# Patient Record
Sex: Female | Born: 1945 | Race: White | Hispanic: No | Marital: Married | Smoking: Never smoker
Health system: Southern US, Community
[De-identification: ages and names within clinical notes are randomized; demographics above are authoritative.]

## PROBLEM LIST (undated history)

## (undated) DIAGNOSIS — I4891 Unspecified atrial fibrillation: Secondary | ICD-10-CM

## (undated) DIAGNOSIS — D649 Anemia, unspecified: Secondary | ICD-10-CM

## (undated) DIAGNOSIS — L02419 Cutaneous abscess of limb, unspecified: Secondary | ICD-10-CM

## (undated) DIAGNOSIS — E119 Type 2 diabetes mellitus without complications: Secondary | ICD-10-CM

## (undated) DIAGNOSIS — D61818 Other pancytopenia: Secondary | ICD-10-CM

## (undated) DIAGNOSIS — I10 Essential (primary) hypertension: Secondary | ICD-10-CM

## (undated) DIAGNOSIS — K7682 Hepatic encephalopathy: Secondary | ICD-10-CM

## (undated) DIAGNOSIS — K7290 Hepatic failure, unspecified without coma: Secondary | ICD-10-CM

## (undated) DIAGNOSIS — A419 Sepsis, unspecified organism: Secondary | ICD-10-CM

## (undated) DIAGNOSIS — R791 Abnormal coagulation profile: Secondary | ICD-10-CM

## (undated) DIAGNOSIS — E039 Hypothyroidism, unspecified: Secondary | ICD-10-CM

## (undated) HISTORY — PX: CARDIAC CATHETERIZATION: SHX172

## (undated) HISTORY — PX: BACK SURGERY: SHX140

## (undated) HISTORY — PX: HERNIA REPAIR: SHX51

---

## 2020-12-18 ENCOUNTER — Emergency Department (HOSPITAL_COMMUNITY): Payer: Medicare Other

## 2020-12-18 ENCOUNTER — Observation Stay (HOSPITAL_COMMUNITY)
Admission: EM | Admit: 2020-12-18 | Discharge: 2020-12-20 | Disposition: A | Discharge status: Home / Self Care | Payer: Medicare Other | Attending: Family Medicine | Admitting: Family Medicine

## 2020-12-18 ENCOUNTER — Observation Stay (HOSPITAL_COMMUNITY): Payer: Medicare Other

## 2020-12-18 ENCOUNTER — Encounter (HOSPITAL_COMMUNITY): Payer: Self-pay

## 2020-12-18 DIAGNOSIS — Z20822 Contact with and (suspected) exposure to covid-19: Secondary | ICD-10-CM | POA: Diagnosis not present

## 2020-12-18 DIAGNOSIS — G459 Transient cerebral ischemic attack, unspecified: Secondary | ICD-10-CM | POA: Diagnosis not present

## 2020-12-18 DIAGNOSIS — K7460 Unspecified cirrhosis of liver: Secondary | ICD-10-CM | POA: Insufficient documentation

## 2020-12-18 DIAGNOSIS — I4891 Unspecified atrial fibrillation: Secondary | ICD-10-CM

## 2020-12-18 DIAGNOSIS — R197 Diarrhea, unspecified: Secondary | ICD-10-CM

## 2020-12-18 DIAGNOSIS — K7290 Hepatic failure, unspecified without coma: Secondary | ICD-10-CM | POA: Diagnosis not present

## 2020-12-18 DIAGNOSIS — E119 Type 2 diabetes mellitus without complications: Secondary | ICD-10-CM | POA: Diagnosis not present

## 2020-12-18 DIAGNOSIS — I480 Paroxysmal atrial fibrillation: Secondary | ICD-10-CM | POA: Diagnosis not present

## 2020-12-18 DIAGNOSIS — Z794 Long term (current) use of insulin: Secondary | ICD-10-CM | POA: Insufficient documentation

## 2020-12-18 DIAGNOSIS — L02419 Cutaneous abscess of limb, unspecified: Secondary | ICD-10-CM

## 2020-12-18 DIAGNOSIS — R479 Unspecified speech disturbances: Secondary | ICD-10-CM

## 2020-12-18 DIAGNOSIS — Z79899 Other long term (current) drug therapy: Secondary | ICD-10-CM | POA: Insufficient documentation

## 2020-12-18 DIAGNOSIS — E039 Hypothyroidism, unspecified: Secondary | ICD-10-CM | POA: Diagnosis not present

## 2020-12-18 DIAGNOSIS — K7469 Other cirrhosis of liver: Secondary | ICD-10-CM

## 2020-12-18 DIAGNOSIS — E538 Deficiency of other specified B group vitamins: Secondary | ICD-10-CM | POA: Diagnosis not present

## 2020-12-18 DIAGNOSIS — R2689 Other abnormalities of gait and mobility: Secondary | ICD-10-CM | POA: Insufficient documentation

## 2020-12-18 DIAGNOSIS — Z7901 Long term (current) use of anticoagulants: Secondary | ICD-10-CM | POA: Diagnosis not present

## 2020-12-18 DIAGNOSIS — I10 Essential (primary) hypertension: Secondary | ICD-10-CM | POA: Diagnosis not present

## 2020-12-18 DIAGNOSIS — Z7984 Long term (current) use of oral hypoglycemic drugs: Secondary | ICD-10-CM | POA: Insufficient documentation

## 2020-12-18 DIAGNOSIS — R4182 Altered mental status, unspecified: Secondary | ICD-10-CM | POA: Diagnosis present

## 2020-12-18 DIAGNOSIS — K7682 Hepatic encephalopathy: Secondary | ICD-10-CM

## 2020-12-18 DIAGNOSIS — E1165 Type 2 diabetes mellitus with hyperglycemia: Secondary | ICD-10-CM

## 2020-12-18 HISTORY — DX: Hypothyroidism, unspecified: E03.9

## 2020-12-18 HISTORY — DX: Essential (primary) hypertension: I10

## 2020-12-18 HISTORY — DX: Hepatic encephalopathy: K76.82

## 2020-12-18 HISTORY — DX: Anemia, unspecified: D64.9

## 2020-12-18 HISTORY — DX: Other pancytopenia: D61.818

## 2020-12-18 HISTORY — DX: Hepatic failure, unspecified without coma: K72.90

## 2020-12-18 HISTORY — DX: Type 2 diabetes mellitus without complications: E11.9

## 2020-12-18 HISTORY — DX: Cutaneous abscess of limb, unspecified: L02.419

## 2020-12-18 HISTORY — DX: Abnormal coagulation profile: R79.1

## 2020-12-18 HISTORY — DX: Unspecified atrial fibrillation: I48.91

## 2020-12-18 HISTORY — DX: Sepsis, unspecified organism: A41.9

## 2020-12-18 LAB — COMPREHENSIVE METABOLIC PANEL
ALT: 22 U/L (ref 0–44)
AST: 52 U/L — ABNORMAL HIGH (ref 15–41)
Albumin: 2.4 g/dL — ABNORMAL LOW (ref 3.5–5.0)
Alkaline Phosphatase: 197 U/L — ABNORMAL HIGH (ref 38–126)
Anion gap: 21 — ABNORMAL HIGH (ref 5–15)
BUN: 17 mg/dL (ref 8–23)
CO2: 23 mmol/L (ref 22–32)
Calcium: 8.1 mg/dL — ABNORMAL LOW (ref 8.9–10.3)
Chloride: 98 mmol/L (ref 98–111)
Creatinine, Ser: 1.36 mg/dL — ABNORMAL HIGH (ref 0.44–1.00)
GFR, Estimated: 40 mL/min — ABNORMAL LOW (ref >60)
Glucose, Bld: 140 mg/dL — ABNORMAL HIGH (ref 70–99)
Potassium: 4 mmol/L (ref 3.5–5.1)
Sodium: 142 mmol/L (ref 135–145)
Total Bilirubin: 3.3 mg/dL — ABNORMAL HIGH (ref 0.3–1.2)
Total Protein: 6.1 g/dL — ABNORMAL LOW (ref 6.5–8.1)

## 2020-12-18 LAB — CBC
HCT: 40 % (ref 36.0–46.0)
Hemoglobin: 12.7 g/dL (ref 12.0–15.0)
MCH: 33 pg (ref 26.0–34.0)
MCHC: 31.8 g/dL (ref 30.0–36.0)
MCV: 103.9 fL — ABNORMAL HIGH (ref 80.0–100.0)
Platelets: 237 10*3/uL (ref 150–400)
RBC: 3.85 MIL/uL — ABNORMAL LOW (ref 3.87–5.11)
RDW: 17.1 % — ABNORMAL HIGH (ref 11.5–15.5)
WBC: 8.8 10*3/uL (ref 4.0–10.5)
nRBC: 0 % (ref 0.0–0.2)

## 2020-12-18 LAB — URINALYSIS, ROUTINE W REFLEX MICROSCOPIC
Bilirubin Urine: NEGATIVE
Glucose, UA: NEGATIVE mg/dL
Hgb urine dipstick: NEGATIVE
Ketones, ur: NEGATIVE mg/dL
Leukocytes,Ua: NEGATIVE
Nitrite: NEGATIVE
Protein, ur: NEGATIVE mg/dL
Specific Gravity, Urine: 1.014 (ref 1.005–1.030)
pH: 5 (ref 5.0–8.0)

## 2020-12-18 LAB — CBG MONITORING, ED: Glucose-Capillary: 135 mg/dL — ABNORMAL HIGH (ref 70–99)

## 2020-12-18 LAB — DIFFERENTIAL
Abs Immature Granulocytes: 0.04 10*3/uL (ref 0.00–0.07)
Basophils Absolute: 0.1 10*3/uL (ref 0.0–0.1)
Basophils Relative: 1 %
Eosinophils Absolute: 0.1 10*3/uL (ref 0.0–0.5)
Eosinophils Relative: 1 %
Immature Granulocytes: 1 %
Lymphocytes Relative: 29 %
Lymphs Abs: 2.5 10*3/uL (ref 0.7–4.0)
Monocytes Absolute: 0.8 10*3/uL (ref 0.1–1.0)
Monocytes Relative: 9 %
Neutro Abs: 5.3 10*3/uL (ref 1.7–7.7)
Neutrophils Relative %: 59 %

## 2020-12-18 LAB — I-STAT CHEM 8, ED
BUN: 18 mg/dL (ref 8–23)
Calcium, Ion: 0.84 mmol/L — CL (ref 1.15–1.40)
Chloride: 97 mmol/L — ABNORMAL LOW (ref 98–111)
Creatinine, Ser: 1 mg/dL (ref 0.44–1.00)
Glucose, Bld: 130 mg/dL — ABNORMAL HIGH (ref 70–99)
HCT: 39 % (ref 36.0–46.0)
Hemoglobin: 13.3 g/dL (ref 12.0–15.0)
Potassium: 3.9 mmol/L (ref 3.5–5.1)
Sodium: 142 mmol/L (ref 135–145)
TCO2: 24 mmol/L (ref 22–32)

## 2020-12-18 LAB — LIPID PANEL
Cholesterol: 108 mg/dL (ref 0–200)
HDL: 18 mg/dL — ABNORMAL LOW (ref >40)
LDL Cholesterol: 75 mg/dL (ref 0–99)
Total CHOL/HDL Ratio: 6 RATIO
Triglycerides: 75 mg/dL (ref <150)
VLDL: 15 mg/dL (ref 0–40)

## 2020-12-18 LAB — HEMOGLOBIN A1C
Hgb A1c MFr Bld: 5.8 % — ABNORMAL HIGH (ref 4.8–5.6)
Mean Plasma Glucose: 119.76 mg/dL

## 2020-12-18 LAB — PROTIME-INR
INR: 2.5 — ABNORMAL HIGH (ref 0.8–1.2)
Prothrombin Time: 26.7 seconds — ABNORMAL HIGH (ref 11.4–15.2)

## 2020-12-18 LAB — APTT: aPTT: 39 seconds — ABNORMAL HIGH (ref 24–36)

## 2020-12-18 LAB — TSH: TSH: 1.057 u[IU]/mL (ref 0.350–4.500)

## 2020-12-18 LAB — AMMONIA: Ammonia: 115 umol/L — ABNORMAL HIGH (ref 9–35)

## 2020-12-18 MED ORDER — AMIODARONE HCL IN DEXTROSE 360-4.14 MG/200ML-% IV SOLN
30.0000 mg/h | INTRAVENOUS | Status: DC
  Administered 2020-12-19 (×3): 30 mg/h via INTRAVENOUS
  Filled 2020-12-18 (×3): qty 200

## 2020-12-18 MED ORDER — LORAZEPAM 2 MG/ML IJ SOLN
0.5000 mg | Freq: Once | INTRAMUSCULAR | Status: DC | PRN
  Filled 2020-12-18: qty 1

## 2020-12-18 MED ORDER — SPIRONOLACTONE 25 MG PO TABS
25.0000 mg | ORAL_TABLET | Freq: Every day | ORAL | Status: DC
  Administered 2020-12-19: 25 mg via ORAL
  Filled 2020-12-18 (×2): qty 1

## 2020-12-18 MED ORDER — AMIODARONE LOAD VIA INFUSION
150.0000 mg | Freq: Once | INTRAVENOUS | Status: AC
  Administered 2020-12-18: 150 mg via INTRAVENOUS
  Filled 2020-12-18: qty 83.34

## 2020-12-18 MED ORDER — IOHEXOL 350 MG/ML SOLN
75.0000 mL | Freq: Once | INTRAVENOUS | Status: AC | PRN
  Administered 2020-12-18: 75 mL via INTRAVENOUS

## 2020-12-18 MED ORDER — WARFARIN SODIUM 5 MG PO TABS: 5.0000 mg | ORAL_TABLET | ORAL | Status: DC

## 2020-12-18 MED ORDER — ATORVASTATIN CALCIUM 40 MG PO TABS
40.0000 mg | ORAL_TABLET | Freq: Every day | ORAL | Status: DC
  Administered 2020-12-18: 40 mg via ORAL
  Filled 2020-12-18 (×2): qty 1

## 2020-12-18 MED ORDER — LACTULOSE 10 GM/15ML PO SOLN
10.0000 g | Freq: Three times a day (TID) | ORAL | Status: DC
  Administered 2020-12-18 – 2020-12-20 (×5): 10 g via ORAL
  Filled 2020-12-18 (×3): qty 30
  Filled 2020-12-18: qty 15
  Filled 2020-12-18: qty 30
  Filled 2020-12-18: qty 15
  Filled 2020-12-18: qty 30

## 2020-12-18 MED ORDER — STROKE: EARLY STAGES OF RECOVERY BOOK: Freq: Once | Status: AC

## 2020-12-18 MED ORDER — SODIUM CHLORIDE 0.9 % IV BOLUS
1000.0000 mL | Freq: Once | INTRAVENOUS | Status: AC
  Administered 2020-12-18: 1000 mL via INTRAVENOUS

## 2020-12-18 MED ORDER — METOPROLOL TARTRATE 25 MG PO TABS
25.0000 mg | ORAL_TABLET | Freq: Two times a day (BID) | ORAL | Status: DC
  Filled 2020-12-18: qty 1

## 2020-12-18 MED ORDER — INSULIN ASPART PROT & ASPART (70-30 MIX) 100 UNIT/ML ~~LOC~~ SUSP
20.0000 [IU] | Freq: Two times a day (BID) | SUBCUTANEOUS | Status: DC
  Administered 2020-12-19 – 2020-12-20 (×3): 20 [IU] via SUBCUTANEOUS
  Filled 2020-12-18: qty 10

## 2020-12-18 MED ORDER — NYSTATIN 100000 UNIT/GM EX POWD
Freq: Two times a day (BID) | CUTANEOUS | Status: AC
  Filled 2020-12-18: qty 15

## 2020-12-18 MED ORDER — FOLIC ACID 1 MG PO TABS
1.0000 mg | ORAL_TABLET | Freq: Every day | ORAL | Status: DC
  Administered 2020-12-19 – 2020-12-20 (×2): 1 mg via ORAL
  Filled 2020-12-18 (×2): qty 1

## 2020-12-18 MED ORDER — INSULIN ISOPHANE & REGULAR (HUMAN 70-30)100 UNIT/ML KWIKPEN: 20.0000 [IU] | PEN_INJECTOR | Freq: Two times a day (BID) | SUBCUTANEOUS | Status: DC

## 2020-12-18 MED ORDER — AMIODARONE HCL IN DEXTROSE 360-4.14 MG/200ML-% IV SOLN
60.0000 mg/h | INTRAVENOUS | Status: DC
  Administered 2020-12-19: 60 mg/h via INTRAVENOUS
  Filled 2020-12-18: qty 200

## 2020-12-18 MED ORDER — WARFARIN SODIUM 5 MG PO TABS
5.0000 mg | ORAL_TABLET | Freq: Once | ORAL | Status: AC
  Administered 2020-12-18: 5 mg via ORAL
  Filled 2020-12-18: qty 1

## 2020-12-18 MED ORDER — SODIUM CHLORIDE 0.9 % IV SOLN: INTRAVENOUS | Status: DC

## 2020-12-18 MED ORDER — LORAZEPAM 2 MG/ML IJ SOLN
1.0000 mg | Freq: Once | INTRAMUSCULAR | Status: AC | PRN
  Administered 2020-12-18: 1 mg via INTRAVENOUS

## 2020-12-18 MED ORDER — SODIUM CHLORIDE 0.9% FLUSH
3.0000 mL | Freq: Once | INTRAVENOUS | Status: AC
Start: 2020-12-18 — End: 2020-12-18
  Administered 2020-12-18: 3 mL via INTRAVENOUS

## 2020-12-18 MED ORDER — SENNOSIDES-DOCUSATE SODIUM 8.6-50 MG PO TABS: 1.0000 | ORAL_TABLET | Freq: Every evening | ORAL | Status: DC | PRN

## 2020-12-18 MED ORDER — VITAMIN B-12 1000 MCG PO TABS
1000.0000 ug | ORAL_TABLET | Freq: Every day | ORAL | Status: DC
  Administered 2020-12-19 – 2020-12-20 (×2): 1000 ug via ORAL
  Filled 2020-12-18 (×2): qty 1

## 2020-12-18 MED ORDER — LEVOTHYROXINE SODIUM 112 MCG PO TABS
112.0000 ug | ORAL_TABLET | Freq: Every day | ORAL | Status: DC
  Administered 2020-12-19 – 2020-12-20 (×2): 112 ug via ORAL
  Filled 2020-12-18 (×2): qty 1

## 2020-12-18 MED ORDER — ONDANSETRON 4 MG PO TBDP: 8.0000 mg | ORAL_TABLET | Freq: Three times a day (TID) | ORAL | Status: DC | PRN

## 2020-12-18 MED ORDER — PANTOPRAZOLE SODIUM 40 MG PO TBEC
40.0000 mg | DELAYED_RELEASE_TABLET | Freq: Every day | ORAL | Status: DC
  Administered 2020-12-19 – 2020-12-20 (×2): 40 mg via ORAL
  Filled 2020-12-18 (×2): qty 1

## 2020-12-18 MED ORDER — WARFARIN - PHARMACIST DOSING INPATIENT: Freq: Every day | Status: DC

## 2020-12-18 MED ORDER — ALBUTEROL SULFATE (2.5 MG/3ML) 0.083% IN NEBU: 2.5000 mg | INHALATION_SOLUTION | RESPIRATORY_TRACT | Status: DC | PRN

## 2020-12-18 MED ORDER — INSULIN ASPART 100 UNIT/ML ~~LOC~~ SOLN
0.0000 [IU] | Freq: Three times a day (TID) | SUBCUTANEOUS | Status: DC
  Administered 2020-12-19: 3 [IU] via SUBCUTANEOUS
  Administered 2020-12-19 – 2020-12-20 (×2): 2 [IU] via SUBCUTANEOUS

## 2020-12-18 MED ORDER — FERROUS SULFATE 325 (65 FE) MG PO TABS
325.0000 mg | ORAL_TABLET | Freq: Every day | ORAL | Status: DC
  Administered 2020-12-19 – 2020-12-20 (×2): 325 mg via ORAL
  Filled 2020-12-18 (×2): qty 1

## 2020-12-18 NOTE — ED Notes (Signed)
Blood pressure cuff placed on upper right arm, with larger cuff and readings appear better at this time.

## 2020-12-18 NOTE — Progress Notes (Signed)
Spoke with Consuella Lose RN regarding consult. ED RN able to gain access. Will complete consult at this time per Consuella Lose RN request. Tomasita Morrow, RN VAST

## 2020-12-18 NOTE — Code Documentation (Addendum)
Pt is a 75 yr old female with PMH a fib on coumadin and L leg cellulitis who was last known well this morning at 0700. According to EMS, 911 was called when pt was having aphasia. Upon EMS arrival, she had a breif rt gaze prefference and a left arm drift. Pt arrived [facility] at 1218. She was alert, verbal and had no weakness. She was in AF with HR 130s. CBG and labs drawn, cleared at bridge by EDP. See flowsheet for times and full NIHSS. Pt taken to CT scan at 1225. NCCT neg for hemorrhage per neurologist. Delay getting IV access due to poor vasculature. Once IV access obtained, CTA performed. Pt returned to ED room 34 at 1300. Her NIHSS is 1 for incorrect month. No thrombolytic as on coumadin with INR 2.5.  No NIR as pt LVO negative. Pt will need q 2 hr VS and mNIHSS for 12 hr then q 4. Plan of care discussed with Chauncey Reading.

## 2020-12-18 NOTE — ED Provider Notes (Signed)
[facility] EMERGENCY DEPARTMENT Provider Note   CSN: [id number] Arrival date & time: 12/18/20  1218     History Chief Complaint  Patient presents with  . Code Stroke    Kari Day is a 75 y.o. female.  The history is provided by the patient, medical records, a relative and the EMS personnel. No language interpreter was used.  Neurologic Problem This is a new problem. The current episode started 3 to 5 hours ago. The problem occurs rarely. The problem has been resolved. Pertinent negatives include no chest pain, no abdominal pain, no headaches and no shortness of breath. Nothing aggravates the symptoms. Nothing relieves the symptoms. She has tried nothing for the symptoms. The treatment provided no relief.       No past medical history on file.  There are no problems to display for this patient.      OB History   No obstetric history on file.     No family history on file.     Home Medications Prior to Admission medications   Not on File    Allergies    Patient has no allergy information on record.  Review of Systems   Review of Systems  Constitutional: Negative for chills, diaphoresis, fatigue and fever.  HENT: Negative for congestion.   Eyes: Negative for visual disturbance.  Respiratory: Negative for cough, chest tightness, shortness of breath and wheezing.   Cardiovascular: Negative for chest pain, palpitations and leg swelling.  Gastrointestinal: Negative for abdominal pain, constipation, diarrhea, nausea and vomiting.  Genitourinary: Negative for dysuria and flank pain.  Musculoskeletal: Negative for back pain, neck pain and neck stiffness.  Neurological: Positive for speech difficulty and weakness (per eMS). Negative for dizziness, facial asymmetry, light-headedness, numbness and headaches.  Psychiatric/Behavioral: Negative for agitation and confusion.  All other systems reviewed and are negative.   Physical Exam Updated Vital  Signs BP 96/71   Pulse (!) 149   Temp 97.8 F (36.6 C) (Oral)   Resp 18   SpO2 100%   Physical Exam Vitals and nursing note reviewed.  Constitutional:      General: She is not in acute distress.    Appearance: She is well-developed. She is not ill-appearing, toxic-appearing or diaphoretic.  HENT:     Head: Normocephalic and atraumatic.     Nose: No congestion or rhinorrhea.     Mouth/Throat:     Mouth: Mucous membranes are moist.     Pharynx: No oropharyngeal exudate or posterior oropharyngeal erythema.  Eyes:     Extraocular Movements: Extraocular movements intact.     Conjunctiva/sclera: Conjunctivae normal.     Pupils: Pupils are equal, round, and reactive to light.  Cardiovascular:     Rate and Rhythm: Normal rate and regular rhythm.     Heart sounds: No murmur heard.   Pulmonary:     Effort: Pulmonary effort is normal. No respiratory distress.     Breath sounds: Normal breath sounds.  Abdominal:     General: Abdomen is flat.     Palpations: Abdomen is soft.     Tenderness: There is no abdominal tenderness. There is no right CVA tenderness, left CVA tenderness, guarding or rebound.  Musculoskeletal:     Cervical back: Neck supple.  Skin:    General: Skin is warm and dry.  Neurological:     Mental Status: She is alert.     ED Results / Procedures / Treatments   Labs (all labs ordered are listed, but  only abnormal results are displayed) Labs Reviewed  PROTIME-INR - Abnormal; Notable for the following components:      Result Value   Prothrombin Time 26.7 (*)    INR 2.5 (*)    All other components within normal limits  APTT - Abnormal; Notable for the following components:   aPTT 39 (*)    All other components within normal limits  CBC - Abnormal; Notable for the following components:   RBC 3.85 (*)    MCV 103.9 (*)    RDW 17.1 (*)    All other components within normal limits  COMPREHENSIVE METABOLIC PANEL - Abnormal; Notable for the following components:    Glucose, Bld 140 (*)    Creatinine, Ser 1.36 (*)    Calcium 8.1 (*)    Total Protein 6.1 (*)    Albumin 2.4 (*)    AST 52 (*)    Alkaline Phosphatase 197 (*)    Total Bilirubin 3.3 (*)    GFR, Estimated 40 (*)    Anion gap 21 (*)    All other components within normal limits  HEMOGLOBIN A1C - Abnormal; Notable for the following components:   Hgb A1c MFr Bld 5.8 (*)    All other components within normal limits  LIPID PANEL - Abnormal; Notable for the following components:   HDL 18 (*)    All other components within normal limits  AMMONIA - Abnormal; Notable for the following components:   Ammonia 115 (*)    All other components within normal limits  I-STAT CHEM 8, ED - Abnormal; Notable for the following components:   Chloride 97 (*)    Glucose, Bld 130 (*)    Calcium, Ion 0.84 (*)    All other components within normal limits  CBG MONITORING, ED - Abnormal; Notable for the following components:   Glucose-Capillary 135 (*)    All other components within normal limits  URINE CULTURE  DIFFERENTIAL  TSH  URINALYSIS, ROUTINE W REFLEX MICROSCOPIC    EKG EKG Interpretation  Date/Time:  Wednesday December 18 2020 13:05:57 EDT Ventricular Rate:  125 PR Interval:    QRS Duration: 91 QT Interval:  334 QTC Calculation: 482 R Axis:   88 Text Interpretation: Atrial fibrillation Borderline right axis deviation Borderline low voltage, extremity leads Borderline repolarization abnormality No prior ECG for comparison. No STEMI Confirmed by Theda Belfastegeler, Chris ([id number]) on 12/18/2020 1:27:49 PM   Radiology CT HEAD CODE STROKE WO CONTRAST  Addendum Date: 12/18/2020   ADDENDUM REPORT: 12/18/2020 12:50 ADDENDUM: These results were communicated to Dr. Amada JupiterKirkpatrick At 12:40 pmon 4/13/2022by text page via the [facility] messaging system. Electronically Signed   By: Jackey LogeKyle  Golden DO   On: 12/18/2020 12:50   Result Date: 12/18/2020 CLINICAL DATA:  Code stroke. Neuro deficit, acute, stroke suspected. EXAM: CT  HEAD WITHOUT CONTRAST TECHNIQUE: Contiguous axial images were obtained from the base of the skull through the vertex without intravenous contrast. COMPARISON:  No pertinent prior exams available for comparison. FINDINGS: Brain: Mild cerebral atrophy. There is no acute intracranial hemorrhage. No demarcated cortical infarct. No extra-axial fluid collection. No evidence of intracranial mass. No midline shift. Partially empty sella turcica. Vascular: No hyperdense vessel.  Atherosclerotic calcifications. Skull: Normal. Negative for fracture or focal lesion. Sinuses/Orbits: Visualized orbits show no acute finding. Mild bilateral ethmoid sinus mucosal thickening. ASPECTS [facility]([place] Stroke Program Early CT Score) - Ganglionic level infarction (caudate, lentiform nuclei, internal capsule, insula, M1-M3 cortex): 7 - Supraganglionic infarction (M4-M6 cortex): 3 Total score (0-10  with 10 being normal): 10 IMPRESSION: No evidence of acute intracranial abnormality.  ASPECTS is 10. Electronically Signed: By: Jackey Loge DO On: 12/18/2020 12:44   CT ANGIO HEAD NECK W WO CM (CODE STROKE)  Result Date: 12/18/2020 CLINICAL DATA:  Neuro deficit, acute, stroke suspected. EXAM: CT HEAD WITHOUT CONTRAST CT ANGIOGRAPHY HEAD AND NECK CT PERFUSION BRAIN TECHNIQUE: Multidetector CT imaging of the head and neck was performed using the standard protocol during bolus administration of intravenous contrast. Multiplanar CT image reconstructions and MIPs were obtained to evaluate the vascular anatomy. Carotid stenosis measurements (when applicable) are obtained utilizing NASCET criteria, using the distal internal carotid diameter as the denominator. Multiphase CT imaging of the brain was performed following IV bolus contrast injection. Subsequent parametric perfusion maps were calculated using RAPID software. CONTRAST:  75mL OMNIPAQUE IOHEXOL 350 MG/ML SOLN COMPARISON:  Noncontrast head CT performed earlier today 12/18/2020. FINDINGS: CTA NECK  FINDINGS Aortic arch: Standard aortic branching. Mild atherosclerotic plaque within the visualized aortic arch. No hemodynamically significant innominate or proximal subclavian artery stenosis. Right carotid system: CCA and ICA patent within the neck. Calcified plaque within the mid to distal CCA resulting in less than 50% stenosis. Left carotid system: CCA and ICA patent within the neck without stenosis. Minimal soft and calcified plaque within the CCA. Partially retropharyngeal course of the cervical ICA Vertebral arteries: The vertebral arteries are developmentally diminutive, but patent. Moderate stenosis within the proximal left V1 segment. Skeleton: Cervical spondylosis with multilevel spinal canal and neural foraminal narrowing. Partially imaged thoracic levocurvature. No acute bony abnormality or aggressive osseous lesion. Other neck: No neck mass or cervical lymphadenopathy. Thyroid unremarkable. Upper chest: Nodules within the bilateral lung apices measuring up to 5 mm. Review of the MIP images confirms the above findings CTA HEAD FINDINGS Anterior circulation: The intracranial internal carotid arteries are patent. Calcified plaqu. E within both vessels with no more than mild stenosis The M1 middle cerebral arteries are patent. No M2 proximal branch occlusion or high-grade proximal stenosis is identified. The anterior cerebral arteries are patent with distal branch atherosclerotic irregularity but no significant proximal stenosis. No intracranial aneurysm is identified. Posterior circulation: The intracranial vertebral arteries are patent with mild atherosclerotic irregularity and sites of mild stenosis. The basilar artery is patent. The posterior cerebral arteries are patent. The P1 segments are hypoplastic bilaterally with sizable bilateral posterior communicating arteries. Venous sinuses: Within the limitations of contrast timing, no convincing thrombus. Anatomic variants: As described Review of the MIP  images confirms the above findings No emergent large vessel occlusion. These results were communicated to [facility] at 1:30 pmon 4/13/2022by text page via the [id number] messaging system. IMPRESSION: CTA neck: 1. Common and internal carotid arteries are patent within the neck. Atherosclerotic plaque within the mid to distal right CCA results in less than 50% stenosis. Minimal plaque within the left CCA. 2. The vertebral arteries are developmentally diminutive, but patent. Moderate stenosis of the proximal left V1 segment. 3. Multiple pulmonary nodules within the bilateral lung apices measuring up to 5 mm. No follow-up needed if patient is low-risk (and has no known or suspected primary neoplasm). Non-contrast chest CT can be considered in 12 months if patient is high-risk. This recommendation follows the consensus statement: Guidelines for Management of Incidental Pulmonary Nodules Detected on CT Images: From the Fleischner Society 2017; Radiology 2017; 284:228-243. CTA head: 1. No intracranial large vessel occlusion or proximal high-grade arterial stenosis. 2. Distal ACA branch atherosclerotic irregularity bilaterally. 3. Sites of mild atherosclerotic narrowing  within the V4 vertebral arteries bilaterally. Electronically Signed   By: Jackey Loge DO   On: 12/18/2020 13:32    Procedures Procedures   Medications Ordered in ED Medications  LORazepam (ATIVAN) injection 0.5 mg (has no administration in time range)   stroke: mapping our early stages of recovery book (has no administration in time range)  senna-docusate (Senokot-S) tablet 1 tablet (has no administration in time range)  metoprolol tartrate (LOPRESSOR) tablet 25 mg (has no administration in time range)  spironolactone (ALDACTONE) tablet 25 mg (has no administration in time range)  insulin isophane & regular human (HUMULIN 70/30 MIX) (70-30) 100 UNIT/ML KwikPen 20 Units (has no administration in time range)  levothyroxine (SYNTHROID) tablet 112  mcg (has no administration in time range)  lactulose (CHRONULAC) 10 GM/15ML solution 10 g (has no administration in time range)  ondansetron (ZOFRAN-ODT) disintegrating tablet 8 mg (has no administration in time range)  pantoprazole (PROTONIX) EC tablet 40 mg (has no administration in time range)  ferrous sulfate tablet 325 mg (has no administration in time range)  folic acid (FOLVITE) tablet 1 mg (has no administration in time range)  vitamin B-12 (CYANOCOBALAMIN) tablet 1,000 mcg (has no administration in time range)  warfarin (COUMADIN) tablet 5 mg (has no administration in time range)  albuterol (VENTOLIN HFA) 108 (90 Base) MCG/ACT inhaler 2 puff (has no administration in time range)  sodium chloride flush (NS) 0.9 % injection 3 mL (3 mLs Intravenous Given 12/18/20 1347)  iohexol (OMNIPAQUE) 350 MG/ML injection 75 mL (75 mLs Intravenous Contrast Given 12/18/20 1305)  sodium chloride 0.9 % bolus 1,000 mL (0 mLs Intravenous Stopped 12/18/20 1441)    ED Course  I have reviewed the triage vital signs and the nursing notes.  Pertinent labs & imaging results that were available during my care of the patient were reviewed by me and considered in my medical decision making (see chart for details).    MDM Rules/Calculators/A&P                          Kari Day is a 75 y.o. female with a past medical history significant for hypertension, hypothyroidism, and diabetes who presents as a code stroke from [facility] rehab facility.  According to EMS report, patient was called out for episodes of aphasia that continued to come and go and EMS finding concern for left upper extremity weakness and a right-sided gaze preference.  The EMS team that brought her reports that they did not see any of the weakness or drift but did see several episodes of the aphasia come and go.  Patient is on Coumadin for A. fib chronically and per report her last known well was at 7 AM.  On my initial evaluation, patient's  airway is clear and she was taken to the CT scanner.  For me, she did not have any numbness or weakness in extremities.  She had clear speech and had symmetric smile.  Normal sensation throughout.  Normal extraocular movements and pupil exam.  Patient resting comfortably.  She denies difficulty speaking now.  Neurology feels that this could be either TIA versus focal seizures versus other.  Family thinks that this could be related to elevated ammonia as she has had some speech difficulties when her ammonia gets high in the past.  We will check an ammonia level and per neurology will admit for further TIA work-up including getting MRI and EEG.  Patient did have some soft  blood pressures in the 90s systolic so she was given some fluids which improved her blood pressure into the low 100 range.  Her heart rate also improved from the 130-140's down into the 1 10-1 20 range.  Patient still reports no symptoms to me.  I spoke with daughter who agrees with admission for further work-up and management to rule out TIA or other acute abnormality.  Patient denies any pain at did not have any tenderness on chest or abdominal exam.  Patient resting comfortably.  She reports he does not feel any palpitations or feel her heart racing.  Anticipate admission for further work-up and management.   Patient will be admitted to medicine for further work-up.  She is still waiting on results of ammonia, MRI, and EEG.   Final Clinical Impression(s) / ED Diagnoses Final diagnoses:  Transient speech disturbance    Clinical Impression: 1. Transient speech disturbance     Disposition: Admit  This note was prepared with assistance of Dragon voice recognition software. Occasional wrong-word or sound-a-like substitutions may have occurred due to the inherent limitations of voice recognition software.      Saleemah Mollenhauer, Canary Brim, MD 12/18/20 [phone]

## 2020-12-18 NOTE — Progress Notes (Signed)
ANTICOAGULATION CONSULT NOTE - Initial Consult  Pharmacy Consult for warfarin dosing. Indication: atrial fibrillation  Allergies  Allergen Reactions  . Caffeine   . Codeine   . Dilaudid [Hydromorphone]   . Fish Allergy   . Naproxen   . Nsaids   . Percocet [Oxycodone-Acetaminophen]   . Shellfish Allergy Nausea And Vomiting  . Sulfa Antibiotics    Patient Measurements:    Vital Signs: Temp: 97.8 F (36.6 C) (04/13 1308) Temp Source: Oral (04/13 1308) BP: 114/58 (04/13 1530) Pulse Rate: 149 (04/13 1315)  Labs: Recent Labs    12/18/20 1221 12/18/20 1231  HGB 12.7 13.3  HCT 40.0 39.0  PLT 237  --   APTT 39*  --   LABPROT 26.7*  --   INR 2.5*  --   CREATININE 1.36* 1.00   CrCl cannot be calculated (Unknown ideal weight.).  Medical History: Past Medical History:  Diagnosis Date  . Anemia   . Atrial fibrillation (HCC)   . Cellulitis and abscess of leg   . Diabetes mellitus without complication (HCC)   . Hepatic encephalopathy (HCC)   . Hypertension   . Hypothyroidism   . Leg abscess   . Pancytopenia (HCC)   . Sepsis (HCC)   . Supratherapeutic INR    Assessment: 75 year old female on warfarin PTA for atrial fibrillation. Presented to ED on 4/13 to rule out stroke and found to be in afib with RVR (HR 120s). Code stroke initiated, seen by neurology, and CTa head/neck was without significant stenosis. Pharmacy has been consulted to dose warfarin.    PTA Warfarin: 5 mg daily except 2.5 mg on Friday (recently changed from 6 mg daily except 3 mg on Fridays)  INR was 2.5 on admit (4/13)  Today, INR 2.5. No bleeding noted. CBC wnl with Hgb 13.3, HCT 39, Pltc 237. Scr ok at 1.    Goal of Therapy:  INR 2-3 Monitor platelets by anticoagulation protocol: Yes   Plan:  Warfarin 5 mg PO x1 - Monitor daily INR - Monitor for s/sx of bleeding  Trixie Rude, PharmD PGY1 Acute Care Pharmacy Resident 12/18/2020 4:18 PM  Please check [email or web address] for unit-specific pharmacy  phone numbers.

## 2020-12-18 NOTE — Progress Notes (Signed)
EEG Completed; Results Pending  

## 2020-12-18 NOTE — ED Notes (Signed)
Patient transported to MRI 

## 2020-12-18 NOTE — H&P (Addendum)
History and Physical    DOA: 12/18/2020  PCP: Salli Quarry, PA-C  Patient coming from: [facility]  Chief Complaint: AMS  HPI: Kari Day is a 75 y.o. 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  Patient was admitted to [facility] recently from 2/5-2/22 with sepsis secondary to left lower extremity cellulitis associated with Raoultella septicemia, treated with IV ancef during her hospitalization and discharged on Keflex. She completed Keflex but her pain and redness progressed prompting readmission on 11/07/2020 for Sepsis secondary to left lower extremity cellulitis with abscess-she was initially treated with vancomycin and cefepime->underwent debridement on 3/8. Wound culture grew Raoultella ornitholytica. ID changed antibiotics to Zosyn and Zyvox.  She was discharged to SNF rehab and per ID recommendations  completed a total of 4 weeks of Abx (p.o. Levaquin), end date 12/09/20. Per daughter, patient did end up back in the [facility] hospital (3/23-4/1) for dehydration/AKI and released back to facility.  During this admission she apparently had low platelets and was anemic, received 1 unit of PRBC transfusion.  She was also evaluated past Sunday (4/10) in their ED for bleeding from leg wound in the setting of supratherapeutic INR (>5) and discharged back to facility.   Today patient noted to have periods of aphasia with word finding difficulty and probably some confusion at the facility.  Upon EMS arrival noted to have right gaze preference left arm drift and difficulty speaking clearly.  She was brought to the hospital to rule out stroke and enroute per EMS,  patient had at least 3 episodes of transient expressive aphasia.  On arrival to the ED, patient in rapid A. fib with heart rate in 120s, normotensive and clear speech with NIH 1 due to disorientation.  Code stroke initiated, seen by neurology.  According to the daughter, who is at bedside currently, patient has not been compliant with lactulose for the last 1 week in concern for diarrhea and also bad taste.  Upon daughter's insistence, patient apparently took 1 dose of lactulose early this morning.   ED work-up: Hemoglobin 12.7, PLT 237.  BMP WNL.  INR 2.5.  CT head unremarkable.  CTA head/neck shows mild to moderate atherosclerosis but no significant stenosis.  Seen by neurology.  Ammonia level, MRI head and EEG pending.  Patient did have transient episode of hypotension associated with heart rate 140s while in ED and received 1 L IV fluid bolus with which her heart rate has improved to 120s and BP to 118/50. She is currently complaining of loose stool and soiling of her clothes.  She denies any chest pain, mentating clearly and speech back to baseline according to daughter at bedside. Review of Systems: As per HPI otherwise 10 point review of systems negative.    Past Medical History:  Diagnosis Date  . Anemia   . Atrial fibrillation (HCC)   . Cellulitis and abscess of leg   . Diabetes mellitus without complication (HCC)   . Hepatic encephalopathy (HCC)   . Hypertension   . Hypothyroidism   . Leg abscess   . Pancytopenia (HCC)   . Sepsis (HCC)   . Supratherapeutic INR     Past Surgical History:  Procedure Laterality Date  . Cardiac catheterization 2016  CARDIAC  CATH - NO FINDINGS PER PT.  Marland Kitchen Cholecystectomy 2006  . Colonoscopy  . Foot surgery  . Hemorrhoid surgery 1989  . Hemorrhoid surgery  . Hernia repair 2003, 2007  . Hysterectomy 1982  . Lumbar disc surgery 2005  . Meniscectomy Left 2007  X 2  . Spine surgery 2005   Social history:  reports that she has never smoked. She  has never used smokeless tobacco. No history on file for alcohol use and drug use.   Allergies  Allergen Reactions  . Caffeine   . Codeine   . Dilaudid [Hydromorphone]   . Fish Allergy   . Naproxen   . Nsaids   . Percocet [Oxycodone-Acetaminophen]   . Shellfish Allergy Nausea And Vomiting  . Sulfa Antibiotics    Family history: Mother had CAD, diabetes.  1 brother with diabetes.  No other significant medical history.  No history of cancers or strokes in the family according to daughter.  Prior to Admission medications       Current Meds  Medication Sig  . albuterol (VENTOLIN HFA) 108 (90 Base) MCG/ACT inhaler Inhale 2 puffs into the lungs every 4 (four) hours as needed for wheezing.  Marland Kitchen ALPRAZolam (XANAX) 0.5 MG tablet Take 0.5 mg by mouth at bedtime as needed for sleep.  . Cholecalciferol (VITAMIN D3) 50 MCG (2000 UT) CAPS Take 1 capsule by mouth daily.  . clotrimazole-betamethasone (LOTRISONE) cream Apply 1 application topically 2 (two) times daily. To affected area  . diclofenac Sodium (VOLTAREN) 1 % GEL Apply 2 g topically 4 (four) times daily. To upper neck and back.  . diphenhydrAMINE (BENADRYL) 25 mg capsule Take 25 mg by mouth every 6 (six) hours as needed for itching.  . ferrous sulfate 325 (65 FE) MG tablet Take 325 mg by mouth daily with breakfast.  . folic acid (FOLVITE) 1 MG tablet Take 1 mg by mouth daily.  . furosemide (LASIX) 80 MG tablet Take 80 mg by mouth daily.  . Insulin NPH Isophane & Regular (HUMULIN 70/30 KWIKPEN [place]) Inject 28-30 Units into the skin See admin instructions. 30 units every morning and 28 units every evening  . lactulose (CHRONULAC) 10 GM/15ML solution Take 20 g by mouth 3 (three) times daily.  Marland Kitchen levothyroxine (SYNTHROID) 112 MCG tablet Take 112 mcg by mouth daily.  . metFORMIN (GLUCOPHAGE) 850 MG tablet Take 850 mg by mouth in the morning, at noon, and at bedtime.  . metoprolol tartrate (LOPRESSOR) 25 MG tablet Take 25 mg by mouth 2 (two)  times daily.  Marland Kitchen nystatin ointment (MYCOSTATIN) Apply 1 application topically in the morning, at noon, in the evening, and at bedtime. To the affected area  . ondansetron (ZOFRAN-ODT) 8 MG disintegrating tablet Take 8 mg by mouth every 8 (eight) hours as needed for nausea.  Marland Kitchen oxyCODONE (OXY IR/ROXICODONE) 5 MG immediate release tablet Take 5 mg by mouth every 4 (four) hours as needed for moderate pain.  . pantoprazole (PROTONIX) 40 MG tablet Take 40 mg by mouth daily.  . polyethylene glycol (MIRALAX / GLYCOLAX) 17 g packet Take 17 g by mouth daily as needed for mild constipation.  . potassium chloride (MICRO-K) 10 MEQ CR capsule Take 10 mEq by mouth daily.  Marland Kitchen spironolactone (ALDACTONE) 25 MG tablet Take 25 mg by mouth daily.  Marland Kitchen triamcinolone cream (KENALOG) 0.1 % Apply 1 application topically 2 (two) times daily. To affected area  . vitamin B-12 (CYANOCOBALAMIN) 1000 MCG tablet Take 1,000 mcg by mouth daily.  Marland Kitchen  warfarin (COUMADIN) 5 MG tablet Take 5 mg by mouth See admin instructions.  daily on Sunday, Monday, Tuesday, Wednesday, Thursday, and Saturday (Everyday except Friday).      Physical Exam: Vitals:   12/18/20 1343 12/18/20 1345 12/18/20 1500 12/18/20 1530  BP: (!) (!) 114/58  Pulse:      Resp:  (!) Temp:      TempSrc:      SpO2:        Constitutional: NAD, calm, comfortable Eyes: PERRL, lids and conjunctivae normal ENMT: Mucous membranes are moist. Posterior pharynx clear  Neck: normal, supple, no masses, no thyromegaly Respiratory: clear to auscultation bilaterally, no wheezing, no crackles. Normal respiratory effort. No accessory muscle use.  Cardiovascular: Irregular rhythm, tachycardic, no murmurs / rubs / gallops.  1+ extremity edema.  Abdomen: no tenderness, no masses palpated. No hepatosplenomegaly. Bowel sounds positive.  Musculoskeletal: no clubbing / cyanosis. No joint deformity upper and lower extremities. Good ROM, no contractures.  Normal muscle tone.  Neurologic: CN 2-12 grossly intact. Sensation intact, DTR normal. Strength 5/5 in all 4.  Psychiatric: Normal judgment and insight. Alert and oriented x 3. Normal mood.  SKIN/catheters: Dressing with Ace wrap on left lower extremity, 1+ pitting edema with mild erythema/chronic venous stasis dermatitis and right lower extremity noted  Labs on Admission: I have personally reviewed following labs and imaging studies  CBC: Recent Labs  Lab 12/18/20 1221 12/18/20 1231  WBC 8.8  --   NEUTROABS 5.3  --   HGB 12.7 13.3  HCT 40.0 39.0  MCV 103.9*  --   PLT 237  --    Basic Metabolic Panel: Recent Labs  Lab 12/18/20 1221 12/18/20 1231  NA 142 142  K 4.0 3.9  CL 98 97*  CO2 23  --   GLUCOSE 140* 130*  BUN 17 18  CREATININE 1.36* 1.00  CALCIUM 8.1*  --    GFR: CrCl cannot be calculated (Unknown ideal weight.). Recent Labs  Lab 12/18/20 1221  WBC 8.8   Liver Function Tests: Recent Labs  Lab 12/18/20 1221  AST 52*  ALT 22  ALKPHOS 197*  BILITOT 3.3*  PROT 6.1*  ALBUMIN 2.4*   No results for input(s): LIPASE, AMYLASE in the last 168 hours. Recent Labs  Lab 12/18/20 1336  AMMONIA 115*   Coagulation Profile: Recent Labs  Lab 12/18/20 1221  INR 2.5*   Cardiac Enzymes: No results for input(s): CKTOTAL, CKMB, CKMBINDEX, TROPONINI in the last 168 hours. BNP (last 3 results) No results for input(s): PROBNP in the last 8760 hours. HbA1C: Recent Labs    12/18/20 1354  HGBA1C 5.8*   CBG: Recent Labs  Lab 12/18/20 1224  GLUCAP 135*   Lipid Profile: Recent Labs    12/18/20 1317  CHOL 108  HDL 18*  LDLCALC 75  TRIG 75  CHOLHDL 6.0   Thyroid Function Tests: Recent Labs    12/18/20 1336  TSH 1.057   Anemia Panel: No results for input(s): VITAMINB12, FOLATE, FERRITIN, TIBC, IRON, RETICCTPCT in the last 72 hours. Urine analysis: No results found for: COLORURINE, APPEARANCEUR, LABSPEC, PHURINE, GLUCOSEU, HGBUR, BILIRUBINUR,  KETONESUR, PROTEINUR, UROBILINOGEN, NITRITE, LEUKOCYTESUR  Radiological Exams on Admission: Personally reviewed  CT HEAD CODE STROKE WO CONTRAST  Addendum Date: 12/18/2020   ADDENDUM REPORT: 12/18/2020 12:50 ADDENDUM: These results were communicated to Dr. Amada Jupiter At 12:40 pmon 4/13/2022by text page via the [facility] messaging system. Electronically Signed   By: Jackey Loge DO  On: 12/18/2020 12:50   Result Date: 12/18/2020 CLINICAL DATA:  Code stroke. Neuro deficit, acute, stroke suspected. EXAM: CT HEAD WITHOUT CONTRAST TECHNIQUE: Contiguous axial images were obtained from the base of the skull through the vertex without intravenous contrast. COMPARISON:  No pertinent prior exams available for comparison. FINDINGS: Brain: Mild cerebral atrophy. There is no acute intracranial hemorrhage. No demarcated cortical infarct. No extra-axial fluid collection. No evidence of intracranial mass. No midline shift. Partially empty sella turcica. Vascular: No hyperdense vessel.  Atherosclerotic calcifications. Skull: Normal. Negative for fracture or focal lesion. Sinuses/Orbits: Visualized orbits show no acute finding. Mild bilateral ethmoid sinus mucosal thickening. ASPECTS [facility] Stroke Program Early CT Score) - Ganglionic level infarction (caudate, lentiform nuclei, internal capsule, insula, M1-M3 cortex): 7 - Supraganglionic infarction (M4-M6 cortex): 3 Total score (0-10 with 10 being normal): 10 IMPRESSION: No evidence of acute intracranial abnormality.  ASPECTS is 10. Electronically Signed: By: Jackey Loge DO On: 12/18/2020 12:44   CT ANGIO HEAD NECK W WO CM (CODE STROKE)  Result Date: 12/18/2020 CLINICAL DATA:  Neuro deficit, acute, stroke suspected. EXAM: CT HEAD WITHOUT CONTRAST CT ANGIOGRAPHY HEAD AND NECK CT PERFUSION BRAIN TECHNIQUE: Multidetector CT imaging of the head and neck was performed using the standard protocol during bolus administration of intravenous contrast. Multiplanar CT image  reconstructions and MIPs were obtained to evaluate the vascular anatomy. Carotid stenosis measurements (when applicable) are obtained utilizing NASCET criteria, using the distal internal carotid diameter as the denominator. Multiphase CT imaging of the brain was performed following IV bolus contrast injection. Subsequent parametric perfusion maps were calculated using RAPID software. CONTRAST:  42mL OMNIPAQUE IOHEXOL 350 MG/ML SOLN COMPARISON:  Noncontrast head CT performed earlier today 12/18/2020. FINDINGS: CTA NECK FINDINGS Aortic arch: Standard aortic branching. Mild atherosclerotic plaque within the visualized aortic arch. No hemodynamically significant innominate or proximal subclavian artery stenosis. Right carotid system: CCA and ICA patent within the neck. Calcified plaque within the mid to distal CCA resulting in less than 50% stenosis. Left carotid system: CCA and ICA patent within the neck without stenosis. Minimal soft and calcified plaque within the CCA. Partially retropharyngeal course of the cervical ICA Vertebral arteries: The vertebral arteries are developmentally diminutive, but patent. Moderate stenosis within the proximal left V1 segment. Skeleton: Cervical spondylosis with multilevel spinal canal and neural foraminal narrowing. Partially imaged thoracic levocurvature. No acute bony abnormality or aggressive osseous lesion. Other neck: No neck mass or cervical lymphadenopathy. Thyroid unremarkable. Upper chest: Nodules within the bilateral lung apices measuring up to 5 mm. Review of the MIP images confirms the above findings CTA HEAD FINDINGS Anterior circulation: The intracranial internal carotid arteries are patent. Calcified plaqu. E within both vessels with no more than mild stenosis The M1 middle cerebral arteries are patent. No M2 proximal branch occlusion or high-grade proximal stenosis is identified. The anterior cerebral arteries are patent with distal branch atherosclerotic  irregularity but no significant proximal stenosis. No intracranial aneurysm is identified. Posterior circulation: The intracranial vertebral arteries are patent with mild atherosclerotic irregularity and sites of mild stenosis. The basilar artery is patent. The posterior cerebral arteries are patent. The P1 segments are hypoplastic bilaterally with sizable bilateral posterior communicating arteries. Venous sinuses: Within the limitations of contrast timing, no convincing thrombus. Anatomic variants: As described Review of the MIP images confirms the above findings No emergent large vessel occlusion. These results were communicated to [facility] at 1:30 pmon 4/13/2022by text page via the [facility] messaging system. IMPRESSION: CTA neck: 1. Common and internal  carotid arteries are patent within the neck. Atherosclerotic plaque within the mid to distal right CCA results in less than 50% stenosis. Minimal plaque within the left CCA. 2. The vertebral arteries are developmentally diminutive, but patent. Moderate stenosis of the proximal left V1 segment. 3. Multiple pulmonary nodules within the bilateral lung apices measuring up to 5 mm. No follow-up needed if patient is low-risk (and has no known or suspected primary neoplasm). Non-contrast chest CT can be considered in 12 months if patient is high-risk. This recommendation follows the consensus statement: Guidelines for Management of Incidental Pulmonary Nodules Detected on CT Images: From the Fleischner Society 2017; Radiology 2017; 284:228-243. CTA head: 1. No intracranial large vessel occlusion or proximal high-grade arterial stenosis. 2. Distal ACA branch atherosclerotic irregularity bilaterally. 3. Sites of mild atherosclerotic narrowing within the V4 vertebral arteries bilaterally. Electronically Signed   By: Jackey LogeKyle  Golden DO   On: 12/18/2020 13:32    EKG: Independently reviewed.  Atrial fibrillation@125 , RAD, no acute ST-T changes Assessment and Plan:    Principal Problem:   TIA (transient ischemic attack) Active Problems:   Atrial fibrillation with RVR (HCC)   Diarrhea   Non-alcoholic micronodular cirrhosis of liver (HCC)   Encephalopathy, hepatic (HCC)   HTN (hypertension)   Hypothyroidism   Leg wound    1.  Transient periods of aphasia/confusion: Secondary to TIA versus hepatic encephalopathy versus atypical seizures.  No evidence of stroke or cerebral bleed on CT head.  Seen by neurology and EEG in process.  MRI pending.  Ammonia level has now resulted at 115.  Suspect her findings consistent with hepatic encephalopathy in the setting of lactulose noncompliance.  Patient however reports ongoing diarrhea for the last 1 week.  Will resume lactulose at lower dose and also rule out C. difficile . Low suspicion for stroke, patient anticoagulated with therapeutic INR.  Appreciate neurology evaluation.  Will follow-up echo/EEG results.  Neurochecks/PT/OT evaluation.  Hold sedatives for now-oxycodone, Xanax.  2.  Paroxysmal atrial fibrillation with RVR/hypotension: Could have been  precipitated by volume loss given recent prolonged infection and ongoing diarrhea.  Patient received 1 L of fluid with improvement in heart rate/blood pressure.  Will continue IV fluid at moderate rate and resume low-dose metoprolol with holding parameters.  Resume Coumadin as hemoglobin and platelets WNL.    3.  Diarrhea: Likely lactulose induced but will rule out C. difficile as discussed above given recent prolonged antibiotic course for leg infection.    4.  Diabetes mellitus type II, insulin-dependent:  patient on 70/30 insulin 30 units - 28 units nightly and Metformin at baseline.  Hold Metformin for now, resume 70/30 insulin at lower dose with sliding scale and adjust based on oral intake.  5.  Chronic leg edema: Likely due to liver disease.  Patient denies any history of CHF and last echocardiogram per outside records in 2021 showed normal EF.  Hold Lasix and  concern for hypotension and ongoing diarrhea/volume loss.  Can resume Aldactone.  6.  Nonalcoholic liver cirrhosis with history of hepatic encephalopathy: Resume Aldactone, lactulose at lower dose as discussed above.  Hold Lasix.  Patient has history of pancytopenia with platelet counts dropping to 60s in last hospitalization.  Was also anemic requiring 1 unit PRBC at that time.  Currently CBC values nonconcerning.  7.  GERD: Resume PPI.  Consider holding if C. difficile positive.  8.  B12 deficiency: Resume home medications  9.  Leg wound: Currently has dressing in place.  Resume wound care  at the facility if discharged by a.m.  If prolonged length of stay, can consult IP wound care.  No evidence of fever or white count suggestive of acute issues.  Recently completed prolonged antibiotic course.  DVT prophylaxis: On Coumadin with therapeutic INR  COVID screen: Pending.  According to daughter patient not vaccinated as advised by?  Doctors at [employer] and concern for ongoing infection/liver disease.  Patient willing to be vaccinated prior to discharge.  Code Status:   Full code.Health care proxy would be daughter, Nelson Chimes  Patient/Family Communication: Discussed with patient and all questions answered to satisfaction.  Consults called: Neurology Admission status :Patient will be admitted under OBSERVATION status.The patient's presenting symptoms, physical exam findings, and initial radiographic and laboratory data in the context of their medical condition (improving mental status and hemodynamics) is felt to place them at low risk for further clinical deterioration. Furthermore, it is anticipated that the patient will be medically stable for discharge from the hospital within 2 midnights of hospital stay.  Patient may need to be upgraded to inpatient status if length of stay prolonged due to hemodynamic instability or recurrent confusion.     Alessandra Bevels MD [facility] Pager in [place]  If 7PM-7AM, please contact night-coverage [email or web address]   12/18/2020, 4:00 PM

## 2020-12-18 NOTE — ED Notes (Signed)
Requesting fluid orders of low BP

## 2020-12-18 NOTE — Consult Note (Signed)
Neurology Consultation  Reason for Consult: Transient aphasia, right gaze deviation, possible left arm weakness Referring Physician: Dr. Rush Landmark  CC: Transient aphasia  History is obtained from: EMS, Chart review  HPI: Kari Day is a 75 y.o. female with a medical history significant for chronic atrial fibrillation on coumadin, cellulitis of left lower extremity, thrombocytopenia, essential hypertension, chronic kidney disease stage 3a, type 2 diabetes mellitus, chronic diastolic heart failure, hypothyroidism, NASH, depression and anxiety, coronary artery disease, and GERD who presented to [facility] for evaluation of transient aphasia with brief right gaze and possible left upper extremity weakness at her nursing facility. EMS was activiated by her facility when they noticed that she was having trouble getting her words out and speaking the correct words. When EMS arrived, they noted a brief right gaze preference with a left arm drift that resolved prior to arrival. EMS states that Kari Day remained aphasic for approximately 3 minutes before resolution. On arrival, Kari Day did not have any weakness or persistent aphasia.   LKW: 07:00 tpa given?: no, on coumadin for atrial fibrillation, outside of time window IR Thrombectomy? No, low suspicion for LVO on examination and with initial vessel imaging Modified Rankin Scale: 4-Needs assistance to walk and tend to bodily needs  ROS: A complete ROS was performed and is negative except as noted in the HPI.   Past medical history: atrial fibrillation on coumadin, cellulitis of left lower extremity, thrombocytopenia, essential hypertension, chronic kidney disease stage 3a, type 2 diabetes mellitus, chronic diastolic heart failure, hypothyroidism, NASH, depression and anxiety, coronary artery disease, and GERD  Family history: No history of similar  Social History:  Denies tobacco use  Medications  Current Facility-Administered Medications:  .   sodium chloride flush (NS) 0.9 % injection 3 mL, 3 mL, Intravenous, Once, Tegeler, Canary Brim, MD No current outpatient medications on file.  Exam: Current vital signs: There were no vitals taken for this visit. Vital signs in last 24 hours:   GENERAL: Awake, alert, in no acute distress  Head: Normocephalic and atraumatic, dry mm Eyes: No scleral injection ENT: No OP obstruction, wears eyeglasses, normal conjunctivae LUNGS - Normal respiratory effort. Non-labored breathing CV: Irregular rate and rhythm on cardiac monitor, tachycardia into the 120's-130's  ABDOMEN : Soft, nontender Ext: warm and reddened with 2+ edema  NEURO:  Mental Status: Alert and oriented to self, age, states that the month is May. Does not recall events leading to hospitalization or endorse trouble speaking today. Speech is intact without dysarthria or aphasia. Naming, repetition, fluency, and comprehension intact. No neglect noted.  Cranial Nerves:  II: PERRL 3 mm/brisk. Visual fields full.  III, IV, VI: EOMI. Lid elevation symmetric and full.  V: Sensation is intact to light touch and symmetrical to face.   VII: Face is symmetric resting and smiling.  VIII: Hearing is intact to voice IX, X: Phonation normal.  XI: Normal sternocleidomastoid and trapezius muscle strength XII: Tongue protrudes midline without fasciculations.   Motor: 4/5 strength is all muscle groups with generalized weakness but without vertical drift visualized throughout. Tone is normal. Bulk is normal.  Sensation: Intact to light touch bilaterally in all four extremities. No extinction to DSS present.  Coordination: FTN intact bilaterally. No pronator drift.  Gait- deferred  NIHSS: 1a Level of Conscious.: 0 1b LOC Questions: 1 1c LOC Commands: 0 2 Best Gaze: 0 3 Visual: 0 4 Facial Palsy: 0 5a Motor Arm - left: 0 5b Motor Arm - Right: 0 6a Motor Leg -  Left: 0 6b Motor Leg - Right: 0 7 Limb Ataxia: 0 8 Sensory: 0 9 Best  Language: 0 10 Dysarthria: 0 11 Extinct. and Inatten.: 0 TOTAL: 1  Labs I have reviewed labs in epic and the results pertinent to this consultation are: CBC    Component Value Date/Time   WBC 8.8 12/18/2020 1221   RBC 3.85 (L) 12/18/2020 1221   HGB 13.3 12/18/2020 1231   HCT 39.0 12/18/2020 1231   PLT 237 12/18/2020 1221   MCV 103.9 (H) 12/18/2020 1221   MCH 33.0 12/18/2020 1221   MCHC 31.8 12/18/2020 1221   RDW 17.1 (H) 12/18/2020 1221   LYMPHSABS 2.5 12/18/2020 1221   MONOABS 0.8 12/18/2020 1221   EOSABS 0.1 12/18/2020 1221   BASOSABS 0.1 12/18/2020 1221   CMP     Component Value Date/Time   NA 142 12/18/2020 1231   K 3.9 12/18/2020 1231   CL 97 (L) 12/18/2020 1231   GLUCOSE 130 (H) 12/18/2020 1231   BUN 18 12/18/2020 1231   CREATININE 1.00 12/18/2020 1231    Lipid Panel  No results found for: CHOL, TRIG, HDL, CHOLHDL, VLDL, LDLCALC, LDLDIRECT   Imaging I have reviewed the images obtained: CTA neck: 1. Common and internal carotid arteries are patent within the neck. Atherosclerotic plaque within the mid to distal right CCA results in less than 50% stenosis. Minimal plaque within the left CCA. 2. The vertebral arteries are developmentally diminutive, but patent. Moderate stenosis of the proximal left V1 segment. 3. Multiple pulmonary nodules within the bilateral lung apices measuring up to 5 mm. No follow-up needed if patient is low-risk (and has no known or suspected primary neoplasm).   CTA head: 1. No intracranial large vessel occlusion or proximal high-grade arterial stenosis. 2. Distal ACA branch atherosclerotic irregularity bilaterally. 3. Sites of mild atherosclerotic narrowing within the V4 vertebral arteries bilaterally.  Assessment: 75 year old female with history as above who presented following a transient episode of aphasia at nursing facility and a brief right gaze palsy and possible left upper extremity weakness. - Examination reveals patient  without speech difficulties and an NIHSS of 1 due to incorrectly stating the month. No focal weakness noted on initial examination.  - No tPA given due to resolution of symptoms on hospital arrival and patient on anticoagulation with coumadin at home for atrial fibrillation. No IR activation due to no LVO+ symptoms and initial vessel imaging without concern for LVO. - DDx includes cerebral hypoperfusion / ischemia without infarct or seizure- less likely due to no history of seizures and no obvious provocation. Stroke work up and EEG pending.   Recommendations: - HgbA1c, fasting lipid panel - MRI of the brain without contrast - Frequent neuro checks - Echocardiogram - Routine EEG  - Prophylactic therapy- On home coumadin for atrial fibrillation - Risk factor modification - Telemetry monitoring - PT consult, OT consult, Speech consult  Pt seen by NP/Neuro and later by MD. Note/plan to be edited by MD as needed.  Kari Day, AGAC-NP [facility] Pager: [phone]   I have seen the patient reviewed the above note.  She had a transient episode of right gaze deviation with left arm weakness.  The aphasia that was reported sounds more like confusion, unclear if she was actually aphasic or not.  It is possible that this represents transient ischemic attack, but the daughter reports similar symptoms in the past with hyperammonemia and she has not been compliant with her medications recently.  She will need  further work-up as described as above as well as treatment treatment of her hyperammonemia.  Kari Slot, MD [facility] [phone]  If 7pm- 7am, please page neurology on call as listed in [facility]ION.

## 2020-12-18 NOTE — ED Triage Notes (Signed)
Pt arrived by EMS [facility]. Staff called concerned for episodes of aphasia, staff report episodes of pt being unable to answer questions approprietly and having trouble getting words out   Pt is on a.fib RVR

## 2020-12-18 NOTE — Progress Notes (Addendum)
Patient up to floor from ED with no complaints. Charge Rn received report. Skin assessment notes stage 2 dime sized area on sacrum as well as purple unstagable wound left buttock. Left leg has ace wrap covering so unable to visualize at this time but according to family its an open wound. Will request WOC to make recommendations. Patient is alert and daughter at bedside. VS as recorded. Placed on contact to r/o Cdiff.

## 2020-12-18 NOTE — ED Notes (Signed)
First attempt to call report - no answer  

## 2020-12-18 NOTE — ED Notes (Signed)
Patient refused MRI at this time. Floor aware.

## 2020-12-18 NOTE — ED Notes (Signed)
2nd attempt - awaiting call back from RN or charge.

## 2020-12-18 NOTE — Procedures (Signed)
HISTORY:  75 year old Female presents with altered mental status.  INTRODUCTION:  A digital EEG was performed in the laboratory using the standard international 10/20 system of electrode placement in addition to one channel of EKG monitoring.  Hyperventilation and Photic Stimulation were not performed.  This tracing captures the patient in the awake and drowsy sleep states.     DESCRIPTION OF RECORD:  In the awake state there is diffuse slowing of the background consisting of 4 Hz frequency activity.  There is spontaneous variability seen with increase in background frequency intermittently.  Continuous generalized slowing mainly in the delta range is seen throughout the record.   There were also triphasic waves seen with an anterior-posterior gradient.  Occasionally, these occur on a regularly but only for 6-10 seconds.  At times, they are sharply contoured and therefore, generalized sharp waves in a GPED pattern cannot be excluded.  Heart rate is 120 bpm.  IMPRESSION:  This is an abnormal adult EEG in the awake and drowsy states.  The slowing of the background rhythm, continuous generalized slowing, and triphasic waves are consistent with an underlying moderate to severe encephalopathy.  Alternatively, it could be due to the effect of sedative medications or bilateral cerebral dysfunction.  The GPEDs are most consistent with a severe metabolic, toxic, infectious encephalopathy because they do not last long enough to meet criteria for electrographic seizure. This does not rule out the diagnosis of epilepsy.  If clinically indicated a repeat EEG or 24 hour EEG may be helpful. Clinical correlation is recommended.

## 2020-12-19 ENCOUNTER — Observation Stay (HOSPITAL_BASED_OUTPATIENT_CLINIC_OR_DEPARTMENT_OTHER): Payer: Medicare Other

## 2020-12-19 DIAGNOSIS — G459 Transient cerebral ischemic attack, unspecified: Secondary | ICD-10-CM

## 2020-12-19 DIAGNOSIS — I4891 Unspecified atrial fibrillation: Secondary | ICD-10-CM | POA: Diagnosis not present

## 2020-12-19 LAB — ECHOCARDIOGRAM COMPLETE
AR max vel: 2.16 cm2
AV Area VTI: 1.86 cm2
AV Area mean vel: 1.96 cm2
AV Mean grad: 5 mmHg
AV Peak grad: 9 mmHg
Ao pk vel: 1.5 m/s
Area-P 1/2: 3.77 cm2
Calc EF: 55.5 %
MV M vel: 4.67 m/s
MV Peak grad: 87.2 mmHg
MV VTI: 3.07 cm2
Radius: 0.4 cm
S' Lateral: 3.4 cm
Single Plane A2C EF: 55.8 %
Single Plane A4C EF: 58.1 %

## 2020-12-19 LAB — URINE CULTURE: Culture: 20000 — AB

## 2020-12-19 LAB — LIPID PANEL
Cholesterol: 84 mg/dL (ref 0–200)
HDL: 12 mg/dL — ABNORMAL LOW (ref >40)
LDL Cholesterol: 62 mg/dL (ref 0–99)
Total CHOL/HDL Ratio: 7 RATIO
Triglycerides: 50 mg/dL (ref <150)
VLDL: 10 mg/dL (ref 0–40)

## 2020-12-19 LAB — C DIFFICILE QUICK SCREEN W PCR REFLEX
C Diff antigen: NEGATIVE
C Diff interpretation: NOT DETECTED
C Diff toxin: NEGATIVE

## 2020-12-19 LAB — HEMOGLOBIN A1C
Hgb A1c MFr Bld: 5.8 % — ABNORMAL HIGH (ref 4.8–5.6)
Mean Plasma Glucose: 119.76 mg/dL

## 2020-12-19 LAB — GLUCOSE, CAPILLARY
Glucose-Capillary: 131 mg/dL — ABNORMAL HIGH (ref 70–99)
Glucose-Capillary: 70 mg/dL (ref 70–99)
Glucose-Capillary: 171 mg/dL — ABNORMAL HIGH (ref 70–99)
Glucose-Capillary: 159 mg/dL — ABNORMAL HIGH (ref 70–99)

## 2020-12-19 LAB — SARS CORONAVIRUS 2 (TAT 6-24 HRS): SARS Coronavirus 2: NEGATIVE

## 2020-12-19 LAB — PROTIME-INR
INR: 2.7 — ABNORMAL HIGH (ref 0.8–1.2)
Prothrombin Time: 29 seconds — ABNORMAL HIGH (ref 11.4–15.2)

## 2020-12-19 MED ORDER — WARFARIN SODIUM 5 MG PO TABS
5.0000 mg | ORAL_TABLET | Freq: Once | ORAL | Status: AC
  Administered 2020-12-19: 5 mg via ORAL
  Filled 2020-12-19: qty 1

## 2020-12-19 MED ORDER — LACTATED RINGERS IV BOLUS
1000.0000 mL | Freq: Once | INTRAVENOUS | Status: AC
  Administered 2020-12-19: 1000 mL via INTRAVENOUS

## 2020-12-19 MED ORDER — METOPROLOL TARTRATE 5 MG/5ML IV SOLN
5.0000 mg | Freq: Once | INTRAVENOUS | Status: AC
  Administered 2020-12-19: 5 mg via INTRAVENOUS
  Filled 2020-12-19: qty 5

## 2020-12-19 MED ORDER — METOPROLOL TARTRATE 50 MG PO TABS
50.0000 mg | ORAL_TABLET | Freq: Two times a day (BID) | ORAL | Status: DC
  Administered 2020-12-19 – 2020-12-20 (×2): 50 mg via ORAL
  Filled 2020-12-19 (×2): qty 1

## 2020-12-19 NOTE — Progress Notes (Incomplete)
STROKE TEAM PROGRESS NOTE   SUBJECTIVE (INTERVAL HISTORY) Her RN is at the bedside.  Overall her condition is gradually improving. Pt awake alert and orientated, however, she can not tell me why she is in hospital, she has no insight about current event that led her admission. Per RN, she was not taking her lactulose at NH for her chronic hyperammonemia. On admission, her ammonia level was 115. Since restarted lactulose after admission, her mental status is improving. Pt refused MRI again today. Will repeat CT.    OBJECTIVE Temp:  [97.7 F (36.5 C)-99 F (37.2 C)] 98 F (36.7 C) (04/14 2348) Pulse Rate:  [77-115] 77 (04/14 2348) Cardiac Rhythm: Normal sinus rhythm (04/14 1900) Resp:  [16-21] 17 (04/14 2348) BP: (99-129)/(49-85) 99/51 (04/14 2348) SpO2:  [95 %-100 %] 96 % (04/14 2348)  Recent Labs  Lab 12/18/20 1224 12/19/20 0717 12/19/20 1326 12/19/20 1702 12/19/20 2127  GLUCAP 135* 131* 70 171* 159*   Recent Labs  Lab 12/18/20 1221 12/18/20 1231  NA 142 142  K 4.0 3.9  CL 98 97*  CO2 23  --   GLUCOSE 140* 130*  BUN 17 18  CREATININE 1.36* 1.00  CALCIUM 8.1*  --    Recent Labs  Lab 12/18/20 1221  AST 52*  ALT 22  ALKPHOS 197*  BILITOT 3.3*  PROT 6.1*  ALBUMIN 2.4*   Recent Labs  Lab 12/18/20 1221 12/18/20 1231  WBC 8.8  --   NEUTROABS 5.3  --   HGB 12.7 13.3  HCT 40.0 39.0  MCV 103.9*  --   PLT 237  --    No results for input(s): CKTOTAL, CKMB, CKMBINDEX, TROPONINI in the last 168 hours. Recent Labs    12/18/20 1221 12/19/20 0234  LABPROT 26.7* 29.0*  INR 2.5* 2.7*   Recent Labs    12/18/20 1403  COLORURINE STRAW*  LABSPEC 1.014  PHURINE 5.0  GLUCOSEU NEGATIVE  HGBUR NEGATIVE  BILIRUBINUR NEGATIVE  KETONESUR NEGATIVE  PROTEINUR NEGATIVE  NITRITE NEGATIVE  LEUKOCYTESUR NEGATIVE       Component Value Date/Time   CHOL 84 12/19/2020 0234   TRIG 50 12/19/2020 0234   HDL 12 (L) 12/19/2020 0234   CHOLHDL 7.0 12/19/2020 0234   VLDL 10  12/19/2020 0234   LDLCALC 62 12/19/2020 0234   Lab Results  Component Value Date   HGBA1C 5.8 (H) 12/19/2020   No results found for: LABOPIA, COCAINSCRNUR, LABBENZ, AMPHETMU, THCU, LABBARB  No results for input(s): ETH in the last 168 hours.  I have personally reviewed the radiological images below and agree with the radiology interpretations.  EEG adult  Result Date: 12/18/2020 [name], MD     12/18/2020  7:30 PM HISTORY:  75 year old Female presents with altered mental status. INTRODUCTION:  A digital EEG was performed in the laboratory using the standard international 10/20 system of electrode placement in addition to one channel of EKG monitoring.  Hyperventilation and Photic Stimulation were not performed.  This tracing captures the patient in the awake and drowsy sleep states.  DESCRIPTION OF RECORD:  In the awake state there is diffuse slowing of the background consisting of 4 Hz frequency activity.  There is spontaneous variability seen with increase in background frequency intermittently.  Continuous generalized slowing mainly in the delta range is seen throughout the record.  There were also triphasic waves seen with an anterior-posterior gradient.  Occasionally, these occur on a regularly but only for 6-10 seconds.  At times, they  are sharply contoured and therefore, generalized sharp waves in a GPED pattern cannot be excluded. Heart rate is 120 bpm. IMPRESSION:  This is an abnormal adult EEG in the awake and drowsy states.  The slowing of the background rhythm, continuous generalized slowing, and triphasic waves are consistent with an underlying moderate to severe encephalopathy.  Alternatively, it could be due to the effect of sedative medications or bilateral cerebral dysfunction.  The GPEDs are most consistent with a severe metabolic, toxic, infectious encephalopathy because they do not last long enough to meet criteria for electrographic seizure. This does not rule out the  diagnosis of epilepsy.  If clinically indicated a repeat EEG or 24 hour EEG may be helpful. Clinical correlation is recommended.   ECHOCARDIOGRAM COMPLETE  Result Date: 12/19/2020    ECHOCARDIOGRAM REPORT   Patient Name:   DOREAN HIEBERT Date of Exam: 12/19/2020 Medical Rec #:  [id number]   Height:       62.0 in Accession #:    [id number]  Weight:       189.0 lb Date of Birth:  02-01-1946    BSA:          1.866 m Patient Age:    74 years    BP:           100/52 mmHg Patient Gender: F           HR:           107 bpm. Exam Location:  Inpatient Procedure: 2D Echo, Cardiac Doppler and Color Doppler Indications:    CVA  History:        Patient has no prior history of Echocardiogram examinations.                 Arrythmias:Atrial Fibrillation; Risk Factors:Hypertension and                 Diabetes.  Sonographer:    Neomia Dear RDCS Referring Phys: [id number] Reyne Dumas [facility]  Sonographer Comments: No cardiac surgery or porcedure noted in Epic IMPRESSIONS  1. Left ventricular ejection fraction, by estimation, is 55 to 60%. The left ventricle has normal function. The left ventricle has no regional wall motion abnormalities. Left ventricular diastolic parameters are indeterminate.  2. Right ventricular systolic function is mildly reduced. The right ventricular size is mildly enlarged. There is normal pulmonary artery systolic pressure.  3. Left atrial size was severely dilated.  4. Right atrial size was moderately dilated.  5. The mitral valve is normal in structure. Trivial mitral valve regurgitation. No evidence of mitral stenosis.  6. The aortic valve is tricuspid. Aortic valve regurgitation is trivial. Mild aortic valve sclerosis is present, with no evidence of aortic valve stenosis.  7. The patient appears to be in atrial fibrillation.  8. There is a pleural effusion. FINDINGS  Left Ventricle: Left ventricular ejection fraction, by estimation, is 55 to 60%. The left ventricle has normal function. The left ventricle has no  regional wall motion abnormalities. The left ventricular internal cavity size was normal in size. There is  no left ventricular hypertrophy. Left ventricular diastolic parameters are indeterminate. Right Ventricle: The right ventricular size is mildly enlarged. No increase in right ventricular wall thickness. Right ventricular systolic function is mildly reduced. There is normal pulmonary artery systolic pressure. The tricuspid regurgitant velocity  is 2.76 m/s, and with an assumed right atrial pressure of 3 mmHg, the estimated right ventricular systolic pressure is 33.5 mmHg. Left Atrium: Left atrial size was severely dilated.  Right Atrium: Right atrial size was moderately dilated. Pericardium: There is a pleural effusion. There is no evidence of pericardial effusion. Mitral Valve: The mitral valve is normal in structure. There is mild calcification of the mitral valve leaflet(s). Mild mitral annular calcification. Trivial mitral valve regurgitation. No evidence of mitral valve stenosis. MV peak gradient, 7.3 mmHg. The mean mitral valve gradient is 4.0 mmHg. Tricuspid Valve: The tricuspid valve is normal in structure. Tricuspid valve regurgitation is mild. Aortic Valve: The aortic valve is tricuspid. Aortic valve regurgitation is trivial. Mild aortic valve sclerosis is present, with no evidence of aortic valve stenosis. Aortic valve mean gradient measures 5.0 mmHg. Aortic valve peak gradient measures 9.0 mmHg. Aortic valve area, by VTI measures 1.86 cm. Pulmonic Valve: The pulmonic valve was normal in structure. Pulmonic valve regurgitation is not visualized. Aorta: The aortic root is normal in size and structure. IAS/Shunts: No atrial level shunt detected by color flow Doppler.  LEFT VENTRICLE PLAX 2D LVIDd:         5.00 cm LVIDs:         3.40 cm LV PW:         1.10 cm LV IVS:        0.90 cm LVOT diam:     2.00 cm LV SV:         57 LV SV Index:   30 LVOT Area:     3.14 cm  LV Volumes (MOD) LV vol d, MOD A2C:  56.5 ml LV vol d, MOD A4C: 58.7 ml LV vol s, MOD A2C: 25.0 ml LV vol s, MOD A4C: 24.6 ml LV SV MOD A2C:     31.5 ml LV SV MOD A4C:     58.7 ml LV SV MOD BP:      32.7 ml RIGHT VENTRICLE RV S prime:     8.38 cm/s TAPSE (M-mode): 1.7 cm LEFT ATRIUM              Index       RIGHT ATRIUM           Index LA diam:        4.50 cm  2.41 cm/m  RA Area:     28.30 cm LA Vol (A2C):   100.0 ml 53.59 ml/m RA Volume:   105.00 ml 56.27 ml/m LA Vol (A4C):   120.0 ml 64.30 ml/m LA Biplane Vol: 119.0 ml 63.77 ml/m  AORTIC VALVE                    PULMONIC VALVE AV Area (Vmax):    2.16 cm     PV Vmax:       1.10 m/s AV Area (Vmean):   1.96 cm     PV Vmean:      85.100 cm/s AV Area (VTI):     1.86 cm     PV VTI:        0.224 m AV Vmax:           150.00 cm/s  PV Peak grad:  4.8 mmHg AV Vmean:          110.000 cm/s PV Mean grad:  3.0 mmHg AV VTI:            0.304 m AV Peak Grad:      9.0 mmHg AV Mean Grad:      5.0 mmHg LVOT Vmax:         103.00 cm/s LVOT Vmean:  68.600 cm/s LVOT VTI:          0.180 m LVOT/AV VTI ratio: 0.59  AORTA Ao Root diam: 3.20 cm Ao Asc diam:  3.50 cm MITRAL VALVE                 TRICUSPID VALVE MV Area (PHT): 3.77 cm      TR Peak grad:   30.5 mmHg MV Area VTI:   3.07 cm      TR Vmax:        276.00 cm/s MV Peak grad:  7.3 mmHg MV Mean grad:  4.0 mmHg      SHUNTS MV Vmax:       1.35 m/s      Systemic VTI:  0.18 m MV Vmean:      95.0 cm/s     Systemic Diam: 2.00 cm MV Decel Time: 201 msec MR Peak grad:    87.2 mmHg MR Mean grad:    61.0 mmHg MR Vmax:         467.00 cm/s MR Vmean:        369.0 cm/s MR PISA:         1.01 cm MR PISA Eff ROA: 5 mm MR PISA Radius:  0.40 cm MV E velocity: 98.10 cm/s Marca Ancona MD Electronically signed by Marca Ancona MD Signature Date/Time: 12/19/2020/5:18:31 PM    Final    CT HEAD CODE STROKE WO CONTRAST  Addendum Date: 12/18/2020   ADDENDUM REPORT: 12/18/2020 12:50 ADDENDUM: These results were communicated to Dr. Amada Jupiter At 12:40 pmon 4/13/2022by text page  via the [facility] messaging system. Electronically Signed   By: Jackey Loge DO   On: 12/18/2020 12:50   Result Date: 12/18/2020 CLINICAL DATA:  Code stroke. Neuro deficit, acute, stroke suspected. EXAM: CT HEAD WITHOUT CONTRAST TECHNIQUE: Contiguous axial images were obtained from the base of the skull through the vertex without intravenous contrast. COMPARISON:  No pertinent prior exams available for comparison. FINDINGS: Brain: Mild cerebral atrophy. There is no acute intracranial hemorrhage. No demarcated cortical infarct. No extra-axial fluid collection. No evidence of intracranial mass. No midline shift. Partially empty sella turcica. Vascular: No hyperdense vessel.  Atherosclerotic calcifications. Skull: Normal. Negative for fracture or focal lesion. Sinuses/Orbits: Visualized orbits show no acute finding. Mild bilateral ethmoid sinus mucosal thickening. ASPECTS [facility] Stroke Program Early CT Score) - Ganglionic level infarction (caudate, lentiform nuclei, internal capsule, insula, M1-M3 cortex): 7 - Supraganglionic infarction (M4-M6 cortex): 3 Total score (0-10 with 10 being normal): 10 IMPRESSION: No evidence of acute intracranial abnormality.  ASPECTS is 10. Electronically Signed: By: Jackey Loge DO On: 12/18/2020 12:44   CT ANGIO HEAD NECK W WO CM (CODE STROKE)  Result Date: 12/18/2020 CLINICAL DATA:  Neuro deficit, acute, stroke suspected. EXAM: CT HEAD WITHOUT CONTRAST CT ANGIOGRAPHY HEAD AND NECK CT PERFUSION BRAIN TECHNIQUE: Multidetector CT imaging of the head and neck was performed using the standard protocol during bolus administration of intravenous contrast. Multiplanar CT image reconstructions and MIPs were obtained to evaluate the vascular anatomy. Carotid stenosis measurements (when applicable) are obtained utilizing NASCET criteria, using the distal internal carotid diameter as the denominator. Multiphase CT imaging of the brain was performed following IV bolus contrast injection.  Subsequent parametric perfusion maps were calculated using RAPID software. CONTRAST:  16mL OMNIPAQUE IOHEXOL 350 MG/ML SOLN COMPARISON:  Noncontrast head CT performed earlier today 12/18/2020. FINDINGS: CTA NECK FINDINGS Aortic arch: Standard aortic branching. Mild atherosclerotic plaque within the visualized aortic arch. No hemodynamically significant  innominate or proximal subclavian artery stenosis. Right carotid system: CCA and ICA patent within the neck. Calcified plaque within the mid to distal CCA resulting in less than 50% stenosis. Left carotid system: CCA and ICA patent within the neck without stenosis. Minimal soft and calcified plaque within the CCA. Partially retropharyngeal course of the cervical ICA Vertebral arteries: The vertebral arteries are developmentally diminutive, but patent. Moderate stenosis within the proximal left V1 segment. Skeleton: Cervical spondylosis with multilevel spinal canal and neural foraminal narrowing. Partially imaged thoracic levocurvature. No acute bony abnormality or aggressive osseous lesion. Other neck: No neck mass or cervical lymphadenopathy. Thyroid unremarkable. Upper chest: Nodules within the bilateral lung apices measuring up to 5 mm. Review of the MIP images confirms the above findings CTA HEAD FINDINGS Anterior circulation: The intracranial internal carotid arteries are patent. Calcified plaqu. E within both vessels with no more than mild stenosis The M1 middle cerebral arteries are patent. No M2 proximal branch occlusion or high-grade proximal stenosis is identified. The anterior cerebral arteries are patent with distal branch atherosclerotic irregularity but no significant proximal stenosis. No intracranial aneurysm is identified. Posterior circulation: The intracranial vertebral arteries are patent with mild atherosclerotic irregularity and sites of mild stenosis. The basilar artery is patent. The posterior cerebral arteries are patent. The P1 segments are  hypoplastic bilaterally with sizable bilateral posterior communicating arteries. Venous sinuses: Within the limitations of contrast timing, no convincing thrombus. Anatomic variants: As described Review of the MIP images confirms the above findings No emergent large vessel occlusion. These results were communicated to [facility] at 1:30 pmon 4/13/2022by text page via the [facility] messaging system. IMPRESSION: CTA neck: 1. Common and internal carotid arteries are patent within the neck. Atherosclerotic plaque within the mid to distal right CCA results in less than 50% stenosis. Minimal plaque within the left CCA. 2. The vertebral arteries are developmentally diminutive, but patent. Moderate stenosis of the proximal left V1 segment. 3. Multiple pulmonary nodules within the bilateral lung apices measuring up to 5 mm. No follow-up needed if patient is low-risk (and has no known or suspected primary neoplasm). Non-contrast chest CT can be considered in 12 months if patient is high-risk. This recommendation follows the consensus statement: Guidelines for Management of Incidental Pulmonary Nodules Detected on CT Images: From the Fleischner Society 2017; Radiology 2017; 284:228-243. CTA head: 1. No intracranial large vessel occlusion or proximal high-grade arterial stenosis. 2. Distal ACA branch atherosclerotic irregularity bilaterally. 3. Sites of mild atherosclerotic narrowing within the V4 vertebral arteries bilaterally. Electronically Signed   By: Jackey Loge DO   On: 12/18/2020 13:32     PHYSICAL EXAM  Temp:  [97.7 F (36.5 C)-99 F (37.2 C)] 98 F (36.7 C) (04/14 2348) Pulse Rate:  [77-115] 77 (04/14 2348) Resp:  [16-21] 17 (04/14 2348) BP: (99-129)/(49-85) 99/51 (04/14 2348) SpO2:  [95 %-100 %] 96 % (04/14 2348)  General - Well nourished, well developed, in no apparent distress.  Ophthalmologic - fundi not visualized due to noncooperation.  Cardiovascular - Regular rhythm and rate.  Mental Status  -  Level of arousal and orientation to time, place, and person were intact. Language including expression, naming, repetition, comprehension was assessed and found intact.  Cranial Nerves II - XII - II - Visual field intact OU. III, IV, VI - Extraocular movements intact. V - Facial sensation intact bilaterally. VII - Facial movement intact bilaterally. VIII - Hearing & vestibular intact bilaterally. X - Palate elevates symmetrically. XI - Chin turning & shoulder  shrug intact bilaterally. XII - Tongue protrusion intact.  Motor Strength - The patient's strength was normal in all extremities and pronator drift was absent.  Bulk was normal and fasciculations were absent.   Motor Tone - Muscle tone was assessed at the neck and appendages and was normal.  Reflexes - The patient's reflexes were symmetrical in all extremities and she had no pathological reflexes.  Sensory - Light touch, temperature/pinprick were assessed and were symmetrical.    Coordination - The patient had normal movements in the hands with no ataxia or dysmetria.  Tremor was absent.  Gait and Station - deferred.   ASSESSMENT/PLAN Ms. Denice ParadiseKay Laughridge is a 75 y.o. female with history of A. fib on Coumadin, left lower extremity cellulitis, thrombocytopenia, hypertension, CKD, diabetes, CHF and CAD admitted for episode of aphasia, right-sided gaze, left-sided weakness. No tPA given due to On Coumadin, outside window.    Likely encephalopathy due to hyperammonemia  CT no acute abnormality  CT head and neck no LVO  MRI patient refused  Repeat CT pending  2D Echo EF 55 to 60%  LDL 62  HgbA1c 5.8  INR 2.5->2.7  Warfarin for VTE prophylaxis  warfarin daily prior to admission, now on warfarin daily.   Patient counseled to be compliant with her antithrombotic medications  Ongoing aggressive stroke risk factor management  Therapy recommendations:  ***  Disposition:  ***  Diabetes  HgbA1c *** goal <  7.0  Unc***Controlled  Currently on ***  CBG monitoring  SSI  DM education and close PCP follow up  Hypertension . Uns***Stable . Permissive hypertension (OK if <220/120) for 24-48 hours post stroke and then gradually normalized within 5-7 days.  Long term BP goal ***  Hyperlipidemia  Home meds:  ***   LDL ***, goal < 70  Now on ***  Continue statin at discharge  Other Stroke Risk Factors  ***Advanced age  ***Cigarette smoker, advised to stop smoking  ***ETOH use  ***Obesity, There is no height or weight on file to calculate BMI.   ***Hx stroke/TIA  ***Family hx stroke (***)  ***Coronary artery disease  ***Migraines  ***Obstructive sleep apnea, on CPAP at home  Other Active Problems  [facility]***  Hospital day # 0    Marvel PlanJindong Xu, MD PhD Stroke Neurology 12/19/2020 11:53 PM    To contact Stroke Continuity provider, please refer to [email or web address]. After hours, contact General Neurology

## 2020-12-19 NOTE — Evaluation (Signed)
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 Noted several recent admissions to [facility] for LLE cellulitis, sepsis and dehyrdation/aki. CT negative, MRI pending. EEG abnormal with neurology recommending repeat or 24hr EEG. Admitted with TIA vs hepatic encephalopathy versus atypical seizures.  Clinical Impression  Patient presents with decreased mobility due to decreased strength, decreased balance, decreased activity tolerance with back and leg pain and HR up to 161 with standing activity.  She normally has been participating at [facility] with PT and ambulatory to bathroom with staff per pt.  She currently need mod A for bed mobility and min A for standing activity.  She will benefit from continued skilled PT in the acute setting and from resuming PT at SNF at d/c.     Follow Up Recommendations SNF    Equipment Recommendations  None recommended by PT    Recommendations for Other Services       Precautions / Restrictions Precautions Precautions: Fall Precaution Comments: sacral wound, L LE wrapped Restrictions Weight Bearing Restrictions: No      Mobility  Bed Mobility Overal bed mobility: Needs Assistance Bed Mobility: Rolling;Sit to Sidelying;Supine to Sit Rolling: Min assist   Supine to sit: Mod assist;HOB elevated   Sit to sidelying: Mod assist General bed mobility comments: patient rolling during hygiene with min assist to L and R, mod assist to EOB for trunk support to ascend and BLE support back to sidelying    Transfers Overall transfer level: Needs assistance Equipment used: Rolling walker (2 wheeled) Transfers: Sit to/from Stand Sit to  Stand: Min assist;+2 safety/equipment         General transfer comment: min assist to power up and steady from EOB  Ambulation/Gait Ambulation/Gait assistance: Min assist Gait Distance (Feet): 2 Feet Assistive device: Rolling walker (2 wheeled) Gait Pattern/deviations: Step-to pattern     General Gait Details: side steps to [facility], limited due to HR up to 161 with standing activity  Stairs            Wheelchair Mobility    Modified Rankin (Stroke Patients Only) Modified Rankin (Stroke Patients Only) Pre-Morbid Rankin Score: Moderately severe disability Modified Rankin: Moderately severe disability     Balance Overall balance assessment: Needs assistance Sitting-balance support: No upper extremity supported;Feet supported Sitting balance-Leahy Scale: Fair     Standing balance support: Bilateral upper extremity supported;During functional activity Standing balance-Leahy Scale: Poor Standing balance comment: relies on BUE and external support                             Pertinent Vitals/Pain Pain Assessment: Faces Faces Pain Scale: Hurts even more Pain Location: low back, L LE Pain Descriptors / Indicators: Discomfort;Dull Pain Intervention(s): Monitored during session;Limited activity within patient's tolerance;Repositioned    Home Living Family/patient expects to be discharged to:: Skilled nursing facility                      Prior Function Level of Independence: Needs assistance   Gait / Transfers Assistance Needed: reports using RW with assist for limited moblity at SNF  ADL's / Homemaking Assistance Needed: reports needing some assist for ADLs at [facility]  Comments: prior to initial hospitalization, pt at home with spouse     Hand Dominance   Dominant Hand: Right    Extremity/Trunk Assessment   Upper Extremity Assessment Upper Extremity Assessment: Defer to OT evaluation LUE Deficits / Details: L UE slightly weaker than R UE,  grossly 3-/5 MMT; sensation WFL LUE Sensation: WNL LUE Coordination: WNL    Lower Extremity Assessment Lower Extremity Assessment: RLE deficits/detail;LLE deficits/detail RLE Deficits / Details: AROM WFL, strength 4- to 4/5 overall LLE Deficits / Details: AROM WFL, strength ankle DF 3+/5 hip flexion 4-/5, knee extension 4/5    Cervical / Trunk Assessment Cervical / Trunk Assessment: Kyphotic  Communication   Communication: No difficulties  Cognition Arousal/Alertness: Awake/alert Behavior During Therapy: Agitated Overall Cognitive Status: No family/caregiver present to determine baseline cognitive functioning Area of Impairment: Following commands;Awareness;Problem solving                       Following Commands: Follows one step commands consistently;Follows one step commands with increased time;Follows multi-step commands inconsistently   Awareness: Emergent Problem Solving: Slow processing;Decreased initiation;Difficulty sequencing;Requires verbal cues General Comments: frustrated with being at [facility] as usually goes to [place], unaware of fecal incontinence in the bed      General Comments General comments (skin integrity, edema, etc.): HR 161 standing, but no symptoms per patient; allowed pt to call her daughter as agitated and states she needs her daughter to be here    Exercises     Assessment/Plan    PT Assessment Patient needs continued PT services  PT Problem List Decreased strength;Decreased balance;Decreased mobility;Cardiopulmonary status limiting activity;Decreased activity tolerance;Decreased safety awareness;Decreased knowledge of use of DME       PT Treatment Interventions DME instruction;Balance training;Gait training;Functional mobility training;Patient/family education;Therapeutic activities;Therapeutic exercise;Neuromuscular re-education    PT Goals (Current goals can be found in the Care Plan section)  Acute Rehab PT Goals Patient  Stated Goal: back to rehab PT Goal Formulation: With patient Time For Goal Achievement: 01/02/21 Potential to Achieve Goals: Good    Frequency Min 3X/week   Barriers to discharge        Co-evaluation PT/OT/SLP Co-Evaluation/Treatment: Yes Reason for Co-Treatment: To address functional/ADL transfers;For patient/therapist safety PT goals addressed during session: Mobility/safety with mobility;Balance;Proper use of DME OT goals addressed during session: ADL's and self-care       AM-PAC PT "6 Clicks" Mobility  Outcome Measure Help needed turning from your back to your side while in a flat bed without using bedrails?: A Little Help needed moving from lying on your back to sitting on the side of a flat bed without using bedrails?: A Lot Help needed moving to and from a bed to a chair (including a wheelchair)?: A Little Help needed standing up from a chair using your arms (e.g., wheelchair or bedside chair)?: A Little Help needed to walk in hospital room?: A Little Help needed climbing 3-5 steps with a railing? : A Lot 6 Click Score: 16    End of Session   Activity Tolerance: Treatment limited secondary to medical complications (Comment) (due to tachycardia) Patient left: in bed;with call bell/phone within reach;with nursing/sitter in room   PT Visit Diagnosis: Other abnormalities of gait and mobility (R26.89);Muscle weakness (generalized) (M62.81)    Time: 9390-3009 PT Time Calculation (min) (ACUTE ONLY): 43 min   Charges:   PT Evaluation $PT Eval Moderate Complexity: 1 Mod          Sheran Lawless, PT Acute Rehabilitation  Services Pager:[phone] [phone] 12/19/2020   Kari Day 12/19/2020, 11:00 AM

## 2020-12-19 NOTE — TOC Initial Note (Signed)
Transition of Care [facility]) - Initial/Assessment Note    Patient Details  Name: Kari Day MRN: [id number] Date of Birth: [birth year]  Transition of Care [facility]) CM/SW Contact:    Baldemar Lenis, LCSW Phone Number: 12/19/2020, 2:46 PM  Clinical Narrative:       CSW spoke with patient's daughter, Renea Ee, to confirm plan to return to [place] at discharge. Shanra confirmed plan, and discussed her frustration that they didn't tell her that her mom was refusing her lactulose, and that's what's caused this issue. Per Renea Ee, she's been through this before, and the patient just needs to take her lactulose and she'll be better in a couple days. CSW confirmed with [facility] that they can take the patient back when ready. Patient was admitted for rehab, will need a new insurance authorization prior to returning. Per MD, may be able to transition back to [facility] tomorrow. CSW to submit insurance approval to [facility].    Expected Discharge Plan: Skilled Nursing Facility Barriers to Discharge: Continued Medical Work up,Insurance Authorization   Patient Goals and CMS Choice Patient states their goals for this hospitalization and ongoing recovery are:: patient unable to participate in goal setting, only oriented to self CMS Medicare.gov Compare Post Acute Care list provided to:: Patient Represenative (must comment) Choice offered to / list presented to : Adult Children  Expected Discharge Plan and Services Expected Discharge Plan: Skilled Nursing Facility     Post Acute Care Choice: Skilled Nursing Facility Living arrangements for the past 2 months: Skilled Nursing Facility                                      Prior Living Arrangements/Services Living arrangements for the past 2 months: Skilled Nursing Facility Lives with:: Spouse Patient language and need for interpreter reviewed:: No Do you feel safe going back to the place where you live?: Yes      Need for Family  Participation in Patient Care: Yes (Comment) Care giver support system in place?: Yes (comment)   Criminal Activity/Legal Involvement Pertinent to Current Situation/Hospitalization: No - Comment as needed  Activities of Daily Living      Permission Sought/Granted Permission sought to share information with : Facility Contractor granted to share information with : Yes, Verbal Permission Granted  Share Information with NAME: Renea Ee  Permission granted to share info w AGENCY: [facility]  Permission granted to share info w Relationship: Daughter     Emotional Assessment   Attitude/Demeanor/Rapport: Unable to Assess Affect (typically observed): Unable to Assess Orientation: : Oriented to Self Alcohol / Substance Use: Not Applicable Psych Involvement: No (comment)  Admission diagnosis:  TIA (transient ischemic attack) [G45.9] Transient speech disturbance [R47.9] Patient Active Problem List   Diagnosis Date Noted  . TIA (transient ischemic attack) 12/18/2020  . Atrial fibrillation with RVR (HCC) 12/18/2020  . Diarrhea 12/18/2020  . Non-alcoholic micronodular cirrhosis of liver (HCC) 12/18/2020  . Encephalopathy, hepatic (HCC) 12/18/2020  . HTN (hypertension) 12/18/2020  . Hypothyroidism 12/18/2020  . Leg wound 12/18/2020   PCP:  Salli Quarry, PA-C Pharmacy:   [facility] - [place], [place] - [address] [place] [address] Phone: [phone] Fax: [phone]     Social Determinants of Health (SDOH) Interventions    Readmission Risk Interventions No flowsheet data found.

## 2020-12-19 NOTE — Progress Notes (Signed)
ANTICOAGULATION CONSULT NOTE  Pharmacy Consult for warfarin dosing. Indication: atrial fibrillation  Allergies  Allergen Reactions  . Caffeine   . Codeine   . Dilaudid [Hydromorphone]   . Fish Allergy   . Naproxen   . Nsaids   . Percocet [Oxycodone-Acetaminophen]   . Shellfish Allergy Nausea And Vomiting  . Sulfa Antibiotics    Patient Measurements:    Vital Signs: Temp: 97.7 F (36.5 C) (04/14 0330) Temp Source: Axillary (04/14 0330) BP: 100/52 (04/14 0330) Pulse Rate: 101 (04/14 0147)  Labs: Recent Labs    12/18/20 1221 12/18/20 1231 12/19/20 0234  HGB 12.7 13.3  --   HCT 40.0 39.0  --   PLT 237  --   --   APTT 39*  --   --   LABPROT 26.7*  --  29.0*  INR 2.5*  --  2.7*  CREATININE 1.36* 1.00  --    CrCl cannot be calculated (Unknown ideal weight.).   Assessment: 75 year old female on warfarin PTA for atrial fibrillation. Presented to ED on 4/13 to rule out stroke and found to be in afib with RVR (HR 120s). Code stroke initiated, seen by neurology, and CTa head/neck was without significant stenosis. Pharmacy has been consulted to dose warfarin.    PTA Warfarin: 5 mg daily except 2.5 mg on Friday (recently changed from 6 mg daily except 3 mg on Fridays)  INR was 2.5 on admit (4/13)  INR today 2.7  Goal of Therapy:  INR 2-3 Monitor platelets by anticoagulation protocol: Yes   Plan:  Repeat Warfarin 5 mg PO x 1 per home regimen - Monitor daily INR - Monitor for s/sx of bleeding  Thank you Okey Regal, PharmD 12/19/2020 8:37 AM  Please check [email or web address] for unit-specific pharmacy phone numbers.

## 2020-12-19 NOTE — Progress Notes (Signed)
[facility] PROGRESS NOTE    Marnette Perkins  UEA:[id number] DOB: 10-15-1945 DOA: 12/18/2020 PCP: Salli Quarry, PA-C      Brief Narrative:  Mrs. Lombard is a 75 y.o. F with Elita Boone cirrhosis, hepatic encephalopathy on lactulose, history of ascites, not currently, paroxysmal A. fib on warfarin, IDDM, and hypothyroidism, who presented with confusion.  Had recently been admitted to [facility] for sepsis secondary to left leg cellulitis associated with Raoultella bacteremia, requiring ID consultation and debridement, discharged to SNF, completed 4 weeks Levaquin on 4/4.  In the few days prior to the current admission, family had noted she was confused to begin present trying to plug her phone into the lid of her drinking cup), and then eventually having slowed responses and not responding to questions and so she was sent by EMS to the ER.  Unfortunately EMR distinctly thought she had a gaze preference and left arm drift and so they brought her in as a code stroke.  In the ER, she had heart rate 140s and rapid A. fib, but electrolytes, WBC, INR all normal.  CT head unremarkable.  CTA head and neck unremarkable.  She was evaluated by neurology who felt that she clinically had hepatic encephalopathy, subsequent ammonia level was greater than 100.           Assessment & Plan:  Acute metabolic encephalopathy due to hepatic encephalopathy Earlier notes suggest some focal neurological deficits, but I find this inconsistent, and family and patient report only symptoms of global mental slowing, and confusion consistent with hepatic encephalopathy.  Indeed daughter confirms that the patient was not being given her lactulose on a consistent basis at [facility].  She was restarted on Lactulose and family think she has improved somewhat    -Continue lactulose titrate to 3 BM per day     Atrial fibrillation, paroxysmal with RVR Acquired thrombophilia HR  remains >120 Was started on amiodarone overnight by night coverage, this is relatively contraindicated in setting of liver failure. -Stop amiodarone -Trial IV metoprolol -Increase oral metoprolol -If unsuccessful, would recommend IV diltaizem overnight  -Continue warfarin   Compensated cirrhosis Bili 3.3, albumin low, no ascites.  HE improving as above. -Continue spironolactone -Hold furosdmie   Diabetes, type 2, well controlled, with NASH Glucoses normal. -Continue SS corrections -Hold home 70/30 -Continue statin -Hold home metformin  Hypothyroidism -Continue levothyroxine  GERD -Continue PPI           Disposition: Status is: Observation  The patient will require care spanning > 2 midnights and should be moved to inpatient because: Hemodynamically unstable, Altered mental status and patient's heart rate remains >120 with symptoms of weakness and medications being adjusted, also patient still encephalopathic, requiring ongoing lactulose  Dispo: The patient is from: Home              Anticipated d/c is to: Home              Patient currently is not medically stable to d/c.   Difficult to place patient No       Level of care: Progressive     Admitted with HE.  Today improved but at baseline she is active, mentates clearly, and I feel she is still somewhat slowed.  We will give additional day lactulose and trial improvement in HR.  If HR imrpoved to <100 tomorrow with increased metoprolol and mentation stable or better, will likely d/c either back to SNF or to home (family prefer back to  home) tomorrow        MDM: The below labs and imaging reports were reviewed and summarized above.  Medication management as above.Anticoagulatant managed.    DVT prophylaxis:  warfarin (COUMADIN) tablet 5 mg  Code Status: FULL Family Communication: Daughter    Consultants:   Neurology, Stroke  Procedures:   4/13 CT head  4/13 CTA head and neck  4/14  repeat CT head, so far refused by patient  Antimicrobials:   None   Culture data:   None           Subjective: Patient is weak, tired, somewhat confused.  She has no vomiting, no dysuria, no increasing pain or swelling of her legs.  No fever.  No respiratory symptoms.  Objective: Vitals:   12/19/20 0330 12/19/20 0850 12/19/20 1256 12/19/20 1637  BP: (!) 100/52 (!) 103/52 116/61 101/69  Pulse:  (!) 103 (!) 101 (!) 115  Resp: 18 17 19 17   Temp: 97.7 F (36.5 C) 98.7 F (37.1 C) 98.6 F (37 C) 99 F (37.2 C)  TempSrc: Axillary Oral Oral Oral  SpO2: 95% 96% 100% 99%    Intake/Output Summary (Last 24 hours) at 12/19/2020 1735 Last data filed at 12/19/2020 1100 Gross per 24 hour  Intake 2657.33 ml  Output 700 ml  Net 1957.33 ml   There were no vitals filed for this visit.  Examination: General appearance:  adult female, awake, lying in bed, interactive, no obvious distress.   HEENT: Mildly icteric, conjunctiva pink, lids and lashes normal. No nasal deformity, discharge, epistaxis.  Lips moist.   Skin: Warm and dry.  no jaundice.  No suspicious rashes or lesions. Cardiac: Tachycardic, mostly regular, nl S1-S2, no murmurs appreciated.  Capillary refill is brisk  JVP not visible.  Mild LE edema on the right, left leg wrapped.  Radial  pulses 2+ and symmetric. Respiratory: Normal respiratory rate and rhythm.  CTAB without rales or wheezes. Abdomen: Abdomen soft.  No TTP or guarding. No ascites, distension, hepatosplenomegaly.   MSK: No deformities or effusions. Neuro: Awake and interactive, some psychomotor slowing is obvious.  EOMI, moves all extremities with severe generalized weakness, no focal weakness, no focal numbness. Speech fluent.    Psych: Sensorium intact and responding to questions, attention somewhat delayed. Affect blunted.  Judgment and insight appear mildly impaired.    Data Reviewed: I have personally reviewed following labs and imaging  studies:  CBC: Recent Labs  Lab 12/18/20 1221 12/18/20 1231  WBC 8.8  --   NEUTROABS 5.3  --   HGB 12.7 13.3  HCT 40.0 39.0  MCV 103.9*  --   PLT 237  --    Basic Metabolic Panel: Recent Labs  Lab 12/18/20 1221 12/18/20 1231  NA 142 142  K 4.0 3.9  CL 98 97*  CO2 23  --   GLUCOSE 140* 130*  BUN 17 18  CREATININE 1.36* 1.00  CALCIUM 8.1*  --    GFR: CrCl cannot be calculated (Unknown ideal weight.). Liver Function Tests: Recent Labs  Lab 12/18/20 1221  AST 52*  ALT 22  ALKPHOS 197*  BILITOT 3.3*  PROT 6.1*  ALBUMIN 2.4*   No results for input(s): LIPASE, AMYLASE in the last 168 hours. Recent Labs  Lab 12/18/20 1336  AMMONIA 115*   Coagulation Profile: Recent Labs  Lab 12/18/20 1221 12/19/20 0234  INR 2.5* 2.7*   Cardiac Enzymes: No results for input(s): CKTOTAL, CKMB, CKMBINDEX, TROPONINI in the last 168 hours. BNP (last  3 results) No results for input(s): PROBNP in the last 8760 hours. HbA1C: Recent Labs    12/28/20 1354 12/19/20 0234  HGBA1C 5.8* 5.8*   CBG: Recent Labs  Lab 12/28/2020 1224 12/19/20 0717 12/19/20 1326 12/19/20 1702  GLUCAP 135* 131* 70 171*   Lipid Profile: Recent Labs    28-Dec-2020 1317 12/19/20 0234  CHOL 108 84  HDL 18* 12*  LDLCALC 75 62  TRIG 75 50  CHOLHDL 6.0 7.0   Thyroid Function Tests: Recent Labs    12-28-2020 1336  TSH 1.057   Anemia Panel: No results for input(s): VITAMINB12, FOLATE, FERRITIN, TIBC, IRON, RETICCTPCT in the last 72 hours. Urine analysis:    Component Value Date/Time   COLORURINE STRAW (A) 2020/12/28 1403   APPEARANCEUR CLEAR Dec 28, 2020 1403   LABSPEC 1.014 12-28-20 1403   PHURINE 5.0 28-Dec-2020 1403   GLUCOSEU NEGATIVE 28-Dec-2020 1403   HGBUR NEGATIVE 12/28/2020 1403   BILIRUBINUR NEGATIVE 12/28/20 1403   KETONESUR NEGATIVE 2020-12-28 1403   PROTEINUR NEGATIVE 2020/12/28 1403   NITRITE NEGATIVE 12-28-2020 1403   LEUKOCYTESUR NEGATIVE 28-Dec-2020 1403   Sepsis  Labs: (procalcitonin:4,lacticacidven:4)  ) Recent Results (from the past 240 hour(s))  Urine culture     Status: Abnormal   Collection Time: 12-28-2020  3:56 PM   Specimen: Urine, Random  Result Value Ref Range Status   Specimen Description URINE, RANDOM  Final   Special Requests   Final    NONE Performed at [facility], [address], [place], [place] [address]    Culture (A)  Final    20,000 COLONIES/mL MULTIPLE SPECIES PRESENT, SUGGEST RECOLLECTION   Report Status 12/19/2020 FINAL  Final  SARS CORONAVIRUS 2 (TAT 6-24 HRS) Nasopharyngeal Nasopharyngeal Swab     Status: None   Collection Time: 12-28-2020 11:28 PM   Specimen: Nasopharyngeal Swab  Result Value Ref Range Status   SARS Coronavirus 2 NEGATIVE NEGATIVE Final    Comment: (NOTE) SARS-CoV-2 target nucleic acids are NOT DETECTED.  The SARS-CoV-2 RNA is generally detectable in upper and lower respiratory specimens during the acute phase of infection. Negative results do not preclude SARS-CoV-2 infection, do not rule out co-infections with other pathogens, and should not be used as the sole basis for treatment or other patient management decisions. Negative results must be combined with clinical observations, patient history, and epidemiological information. The expected result is Negative.  Fact Sheet for Patients: HairSlick.no  Fact Sheet for Healthcare Providers: quierodirigir.com  This test is not yet approved or cleared by the [place] FDA and  has been authorized for detection and/or diagnosis of SARS-CoV-2 by FDA under an Emergency Use Authorization (EUA). This EUA will remain  in effect (meaning this test can be used) for the duration of the COVID-19 declaration under Se ction 564(b)(1) of the Act, 21 U.S.C. section 360bbb-3(b)(1), unless the authorization is terminated or revoked sooner.  Performed at [facility], [address] [place], [place] [address]          Radiology Studies: EEG adult  Result Date: 2020-12-28 [name], MD     28-Dec-2020  7:30 PM HISTORY:  75 year old Female presents with altered mental status. INTRODUCTION:  A digital EEG was performed in the laboratory using the standard international 10/20 system of electrode placement in addition to one channel of EKG monitoring.  Hyperventilation and Photic Stimulation were not performed.  This tracing captures the patient in the awake and drowsy sleep states.  DESCRIPTION OF RECORD:  In the awake state there is diffuse slowing of the background consisting of 4 Hz frequency activity.  There is spontaneous variability seen with increase in background frequency intermittently.  Continuous generalized slowing mainly in the delta range is seen throughout the record.  There were also triphasic waves seen with an anterior-posterior gradient.  Occasionally, these occur on a regularly but only for 6-10 seconds.  At times, they are sharply contoured and therefore, generalized sharp waves in a GPED pattern cannot be excluded. Heart rate is 120 bpm. IMPRESSION:  This is an abnormal adult EEG in the awake and drowsy states.  The slowing of the background rhythm, continuous generalized slowing, and triphasic waves are consistent with an underlying moderate to severe encephalopathy.  Alternatively, it could be due to the effect of sedative medications or bilateral cerebral dysfunction.  The GPEDs are most consistent with a severe metabolic, toxic, infectious encephalopathy because they do not last long enough to meet criteria for electrographic seizure. This does not rule out the diagnosis of epilepsy.  If clinically indicated a repeat EEG or 24 hour EEG may be helpful. Clinical correlation is recommended.   ECHOCARDIOGRAM COMPLETE  Result Date: 12/19/2020    ECHOCARDIOGRAM REPORT   Patient Name:   KEILA TURAN Date of Exam: 12/19/2020 Medical Rec #:  [id number]    Height:       62.0 in Accession #:    [id number]  Weight:       189.0 lb Date of Birth:  1946-04-01    BSA:          1.866 m Patient Age:    74 years    BP:           100/52 mmHg Patient Gender: F           HR:           107 bpm. Exam Location:  Inpatient Procedure: 2D Echo, Cardiac Doppler and Color Doppler Indications:    CVA  History:        Patient has no prior history of Echocardiogram examinations.                 Arrythmias:Atrial Fibrillation; Risk Factors:Hypertension and                 Diabetes.  Sonographer:    Neomia Dear RDCS Referring Phys: [id number] Reyne Dumas [facility]  Sonographer Comments: No cardiac surgery or porcedure noted in Epic IMPRESSIONS  1. Left ventricular ejection fraction, by estimation, is 55 to 60%. The left ventricle has normal function. The left ventricle has no regional wall motion abnormalities. Left ventricular diastolic parameters are indeterminate.  2. Right ventricular systolic function is mildly reduced. The right ventricular size is mildly enlarged. There is normal pulmonary artery systolic pressure.  3. Left atrial size was severely dilated.  4. Right atrial size was moderately dilated.  5. The mitral valve is normal in structure. Trivial mitral valve regurgitation. No evidence of mitral stenosis.  6. The aortic valve is tricuspid. Aortic valve regurgitation is trivial. Mild aortic valve sclerosis is present, with no evidence of aortic valve stenosis.  7. The patient appears to be in atrial fibrillation.  8. There is a pleural effusion. FINDINGS  Left Ventricle: Left ventricular ejection fraction, by estimation, is 55 to 60%. The left ventricle has normal function. The left ventricle has no regional wall motion abnormalities. The left ventricular internal cavity size was normal in size. There is  no left  ventricular hypertrophy. Left ventricular diastolic parameters are indeterminate. Right Ventricle: The right ventricular size is mildly enlarged. No increase in right  ventricular wall thickness. Right ventricular systolic function is mildly reduced. There is normal pulmonary artery systolic pressure. The tricuspid regurgitant velocity  is 2.76 m/s, and with an assumed right atrial pressure of 3 mmHg, the estimated right ventricular systolic pressure is 33.5 mmHg. Left Atrium: Left atrial size was severely dilated. Right Atrium: Right atrial size was moderately dilated. Pericardium: There is a pleural effusion. There is no evidence of pericardial effusion. Mitral Valve: The mitral valve is normal in structure. There is mild calcification of the mitral valve leaflet(s). Mild mitral annular calcification. Trivial mitral valve regurgitation. No evidence of mitral valve stenosis. MV peak gradient, 7.3 mmHg. The mean mitral valve gradient is 4.0 mmHg. Tricuspid Valve: The tricuspid valve is normal in structure. Tricuspid valve regurgitation is mild. Aortic Valve: The aortic valve is tricuspid. Aortic valve regurgitation is trivial. Mild aortic valve sclerosis is present, with no evidence of aortic valve stenosis. Aortic valve mean gradient measures 5.0 mmHg. Aortic valve peak gradient measures 9.0 mmHg. Aortic valve area, by VTI measures 1.86 cm. Pulmonic Valve: The pulmonic valve was normal in structure. Pulmonic valve regurgitation is not visualized. Aorta: The aortic root is normal in size and structure. IAS/Shunts: No atrial level shunt detected by color flow Doppler.  LEFT VENTRICLE PLAX 2D LVIDd:         5.00 cm LVIDs:         3.40 cm LV PW:         1.10 cm LV IVS:        0.90 cm LVOT diam:     2.00 cm LV SV:         57 LV SV Index:   30 LVOT Area:     3.14 cm  LV Volumes (MOD) LV vol d, MOD A2C: 56.5 ml LV vol d, MOD A4C: 58.7 ml LV vol s, MOD A2C: 25.0 ml LV vol s, MOD A4C: 24.6 ml LV SV MOD A2C:     31.5 ml LV SV MOD A4C:     58.7 ml LV SV MOD BP:      32.7 ml RIGHT VENTRICLE RV S prime:     8.38 cm/s TAPSE (M-mode): 1.7 cm LEFT ATRIUM              Index       RIGHT ATRIUM            Index LA diam:        4.50 cm  2.41 cm/m  RA Area:     28.30 cm LA Vol (A2C):   100.0 ml 53.59 ml/m RA Volume:   105.00 ml 56.27 ml/m LA Vol (A4C):   120.0 ml 64.30 ml/m LA Biplane Vol: 119.0 ml 63.77 ml/m  AORTIC VALVE                    PULMONIC VALVE AV Area (Vmax):    2.16 cm     PV Vmax:       1.10 m/s AV Area (Vmean):   1.96 cm     PV Vmean:      85.100 cm/s AV Area (VTI):     1.86 cm     PV VTI:        0.224 m AV Vmax:           150.00 cm/s  PV Peak grad:  4.8 mmHg  AV Vmean:          110.000 cm/s PV Mean grad:  3.0 mmHg AV VTI:            0.304 m AV Peak Grad:      9.0 mmHg AV Mean Grad:      5.0 mmHg LVOT Vmax:         103.00 cm/s LVOT Vmean:        68.600 cm/s LVOT VTI:          0.180 m LVOT/AV VTI ratio: 0.59  AORTA Ao Root diam: 3.20 cm Ao Asc diam:  3.50 cm MITRAL VALVE                 TRICUSPID VALVE MV Area (PHT): 3.77 cm      TR Peak grad:   30.5 mmHg MV Area VTI:   3.07 cm      TR Vmax:        276.00 cm/s MV Peak grad:  7.3 mmHg MV Mean grad:  4.0 mmHg      SHUNTS MV Vmax:       1.35 m/s      Systemic VTI:  0.18 m MV Vmean:      95.0 cm/s     Systemic Diam: 2.00 cm MV Decel Time: 201 msec MR Peak grad:    87.2 mmHg MR Mean grad:    61.0 mmHg MR Vmax:         467.00 cm/s MR Vmean:        369.0 cm/s MR PISA:         1.01 cm MR PISA Eff ROA: 5 mm MR PISA Radius:  0.40 cm MV E velocity: 98.10 cm/s Marca Ancona MD Electronically signed by Marca Ancona MD Signature Date/Time: 12/19/2020/5:18:31 PM    Final    CT HEAD CODE STROKE WO CONTRAST  Addendum Date: 12/18/2020   ADDENDUM REPORT: 12/18/2020 12:50 ADDENDUM: These results were communicated to Dr. Amada Jupiter At 12:40 pmon 4/13/2022by text page via the [facility] messaging system. Electronically Signed   By: Jackey Loge DO   On: 12/18/2020 12:50   Result Date: 12/18/2020 CLINICAL DATA:  Code stroke. Neuro deficit, acute, stroke suspected. EXAM: CT HEAD WITHOUT CONTRAST TECHNIQUE: Contiguous axial images were obtained from the  base of the skull through the vertex without intravenous contrast. COMPARISON:  No pertinent prior exams available for comparison. FINDINGS: Brain: Mild cerebral atrophy. There is no acute intracranial hemorrhage. No demarcated cortical infarct. No extra-axial fluid collection. No evidence of intracranial mass. No midline shift. Partially empty sella turcica. Vascular: No hyperdense vessel.  Atherosclerotic calcifications. Skull: Normal. Negative for fracture or focal lesion. Sinuses/Orbits: Visualized orbits show no acute finding. Mild bilateral ethmoid sinus mucosal thickening. ASPECTS [facility] Stroke Program Early CT Score) - Ganglionic level infarction (caudate, lentiform nuclei, internal capsule, insula, M1-M3 cortex): 7 - Supraganglionic infarction (M4-M6 cortex): 3 Total score (0-10 with 10 being normal): 10 IMPRESSION: No evidence of acute intracranial abnormality.  ASPECTS is 10. Electronically Signed: By: Jackey Loge DO On: 12/18/2020 12:44   CT ANGIO HEAD NECK W WO CM (CODE STROKE)  Result Date: 12/18/2020 CLINICAL DATA:  Neuro deficit, acute, stroke suspected. EXAM: CT HEAD WITHOUT CONTRAST CT ANGIOGRAPHY HEAD AND NECK CT PERFUSION BRAIN TECHNIQUE: Multidetector CT imaging of the head and neck was performed using the standard protocol during bolus administration of intravenous contrast. Multiplanar CT image reconstructions and MIPs were obtained to evaluate the vascular anatomy. Carotid stenosis measurements (when  applicable) are obtained utilizing NASCET criteria, using the distal internal carotid diameter as the denominator. Multiphase CT imaging of the brain was performed following IV bolus contrast injection. Subsequent parametric perfusion maps were calculated using RAPID software. CONTRAST:  31mL OMNIPAQUE IOHEXOL 350 MG/ML SOLN COMPARISON:  Noncontrast head CT performed earlier today 12/18/2020. FINDINGS: CTA NECK FINDINGS Aortic arch: Standard aortic branching. Mild atherosclerotic plaque  within the visualized aortic arch. No hemodynamically significant innominate or proximal subclavian artery stenosis. Right carotid system: CCA and ICA patent within the neck. Calcified plaque within the mid to distal CCA resulting in less than 50% stenosis. Left carotid system: CCA and ICA patent within the neck without stenosis. Minimal soft and calcified plaque within the CCA. Partially retropharyngeal course of the cervical ICA Vertebral arteries: The vertebral arteries are developmentally diminutive, but patent. Moderate stenosis within the proximal left V1 segment. Skeleton: Cervical spondylosis with multilevel spinal canal and neural foraminal narrowing. Partially imaged thoracic levocurvature. No acute bony abnormality or aggressive osseous lesion. Other neck: No neck mass or cervical lymphadenopathy. Thyroid unremarkable. Upper chest: Nodules within the bilateral lung apices measuring up to 5 mm. Review of the MIP images confirms the above findings CTA HEAD FINDINGS Anterior circulation: The intracranial internal carotid arteries are patent. Calcified plaqu. E within both vessels with no more than mild stenosis The M1 middle cerebral arteries are patent. No M2 proximal branch occlusion or high-grade proximal stenosis is identified. The anterior cerebral arteries are patent with distal branch atherosclerotic irregularity but no significant proximal stenosis. No intracranial aneurysm is identified. Posterior circulation: The intracranial vertebral arteries are patent with mild atherosclerotic irregularity and sites of mild stenosis. The basilar artery is patent. The posterior cerebral arteries are patent. The P1 segments are hypoplastic bilaterally with sizable bilateral posterior communicating arteries. Venous sinuses: Within the limitations of contrast timing, no convincing thrombus. Anatomic variants: As described Review of the MIP images confirms the above findings No emergent large vessel occlusion. These  results were communicated to [facility] at 1:30 pmon 4/13/2022by text page via the [facility] messaging system. IMPRESSION: CTA neck: 1. Common and internal carotid arteries are patent within the neck. Atherosclerotic plaque within the mid to distal right CCA results in less than 50% stenosis. Minimal plaque within the left CCA. 2. The vertebral arteries are developmentally diminutive, but patent. Moderate stenosis of the proximal left V1 segment. 3. Multiple pulmonary nodules within the bilateral lung apices measuring up to 5 mm. No follow-up needed if patient is low-risk (and has no known or suspected primary neoplasm). Non-contrast chest CT can be considered in 12 months if patient is high-risk. This recommendation follows the consensus statement: Guidelines for Management of Incidental Pulmonary Nodules Detected on CT Images: From the Fleischner Society 2017; Radiology 2017; 284:228-243. CTA head: 1. No intracranial large vessel occlusion or proximal high-grade arterial stenosis. 2. Distal ACA branch atherosclerotic irregularity bilaterally. 3. Sites of mild atherosclerotic narrowing within the V4 vertebral arteries bilaterally. Electronically Signed   By: Jackey Loge DO   On: 12/18/2020 13:32        Scheduled Meds: . atorvastatin  40 mg Oral Daily  . ferrous sulfate  325 mg Oral Q breakfast  . folic acid  1 mg Oral Daily  . insulin aspart  0-15 Units Subcutaneous TID WC  . insulin aspart protamine- aspart  20 Units Subcutaneous BID WC  . lactulose  10 g Oral TID  . levothyroxine  112 mcg Oral Daily  . metoprolol tartrate  5 mg  Intravenous Once  . metoprolol tartrate  50 mg Oral BID  . nystatin   Topical BID  . pantoprazole  40 mg Oral Daily  . spironolactone  25 mg Oral Daily  . vitamin B-12  1,000 mcg Oral Daily  . warfarin  5 mg Oral ONCE-1600  . Warfarin - Pharmacist Dosing Inpatient   Does not apply q1600   Continuous Infusions: . sodium chloride 60 mL/hr at 12/19/20 1229     LOS: 0  days    Time spent: 35 minutes    Alberteen Samhristopher P Lakesa Coste, MD [facility] 12/19/2020, 5:35 PM     Please page though AMION or Epic secure chat:  For [employer] password, Higher education careers advisercontact charge nurse

## 2020-12-19 NOTE — Progress Notes (Signed)
Called the floor the see if patient was willing to have MRI scan today.  Patient is extremely claustrophobic and is refusing MRI.  Secure Chat sent to ordering PA.

## 2020-12-19 NOTE — [place] FL2 (Signed)
[place] MEDICAID FL2 LEVEL OF CARE SCREENING TOOL     IDENTIFICATION  Patient Name: Kari Day Birthdate: 1945-09-24 Sex: female Admission Date (Current Location): 12/18/2020  [facility] and [place] Number:  [employer] and Address:  [facility] [place], [address] [place], [place] [address]      Provider Number: [id number]  Attending Physician Name and Address:  Alberteen Sam, *  Relative Name and Phone Number:       Current Level of Care: Hospital Recommended Level of Care: Skilled Nursing Facility Prior Approval Number:    Date Approved/Denied:   PASRR Number:    Discharge Plan: SNF    Current Diagnoses: Patient Active Problem List   Diagnosis Date Noted  . TIA (transient ischemic attack) 12/18/2020  . Atrial fibrillation with RVR (HCC) 12/18/2020  . Diarrhea 12/18/2020  . Non-alcoholic micronodular cirrhosis of liver (HCC) 12/18/2020  . Encephalopathy, hepatic (HCC) 12/18/2020  . HTN (hypertension) 12/18/2020  . Hypothyroidism 12/18/2020  . Leg wound 12/18/2020    Orientation RESPIRATION BLADDER Height & Weight     Self  Normal Incontinent Weight:   Height:     BEHAVIORAL SYMPTOMS/MOOD NEUROLOGICAL BOWEL NUTRITION STATUS      Incontinent Diet (carb modified)  AMBULATORY STATUS COMMUNICATION OF NEEDS Skin   Extensive Assist Verbally PU Stage and Appropriate Care,Other (Comment) (open wound, left tibia, wet to dry dressing, change twice a day)   PU Stage 2 Dressing:  (buttocks, foam dressing: lift every shift to assess, change every 3 days)                   Personal Care Assistance Level of Assistance  Bathing,Feeding,Dressing Bathing Assistance: Maximum assistance Feeding assistance: Limited assistance Dressing Assistance: Maximum assistance     Functional Limitations Info  Sight Sight Info: Impaired        SPECIAL CARE FACTORS FREQUENCY  PT (By licensed PT),OT (By licensed OT)     PT Frequency:  5x/wk OT Frequency: 5x/wk            Contractures Contractures Info: Not present    Additional Factors Info  Code Status,Allergies,Insulin Sliding Scale Code Status Info: Full Allergies Info: Caffeine, Codeine, Dilaudid (Hydromorphone), Fish Allergy, Naproxen, Nsaids, Percocet (Oxycodone-acetaminophen), Shellfish Allergy, Sulfa Antibiotics   Insulin Sliding Scale Info: see DC summary       Current Medications (12/19/2020):  This is the current hospital active medication list Current Facility-Administered Medications  Medication Dose Route Frequency Provider Last Rate Last Admin  . 0.9 %  sodium chloride infusion   Intravenous Continuous Alessandra Bevels, MD 60 mL/hr at 12/19/20 1229 New Bag at 12/19/20 1229  . albuterol (PROVENTIL) (2.5 MG/3ML) 0.083% nebulizer solution 2.5 mg  2.5 mg Nebulization Q4H PRN Alessandra Bevels, MD      . amiodarone (NEXTERONE PREMIX) 360-4.14 MG/200ML-% (1.8 mg/mL) IV infusion  30 mg/hr Intravenous Continuous Chotiner, Claudean Severance, MD 16.67 mL/hr at 12/19/20 1222 30 mg/hr at 12/19/20 1222  . atorvastatin (LIPITOR) tablet 40 mg  40 mg Oral Daily Alessandra Bevels, MD   40 mg at 12/18/20 2213  . ferrous sulfate tablet 325 mg  325 mg Oral Q breakfast Alessandra Bevels, MD   325 mg at 12/19/20 1021  . folic acid (FOLVITE) tablet 1 mg  1 mg Oral Daily Alessandra Bevels, MD   1 mg at 12/19/20 1021  . insulin aspart (novoLOG) injection 0-15 Units  0-15 Units Subcutaneous TID WC Alessandra Bevels, MD   2  Units at 12/19/20 0721  . insulin aspart protamine- aspart (NOVOLOG MIX 70/30) injection 20 Units  20 Units Subcutaneous BID WC Alessandra Bevels, MD   20 Units at 12/19/20 0728  . lactulose (CHRONULAC) 10 GM/15ML solution 10 g  10 g Oral TID Alessandra Bevels, MD   10 g at 12/19/20 1021  . levothyroxine (SYNTHROID) tablet 112 mcg  112 mcg Oral Daily Alessandra Bevels, MD   112 mcg at 12/19/20 0642  . metoprolol tartrate (LOPRESSOR) tablet 25 mg  25 mg Oral BID  Alessandra Bevels, MD      . nystatin (MYCOSTATIN/NYSTOP) topical powder   Topical BID Alessandra Bevels, MD   Given at 12/19/20 1022  . ondansetron (ZOFRAN-ODT) disintegrating tablet 8 mg  8 mg Oral Q8H PRN Alessandra Bevels, MD      . pantoprazole (PROTONIX) EC tablet 40 mg  40 mg Oral Daily Alessandra Bevels, MD   40 mg at 12/19/20 1021  . senna-docusate (Senokot-S) tablet 1 tablet  1 tablet Oral QHS PRN Alessandra Bevels, MD      . spironolactone (ALDACTONE) tablet 25 mg  25 mg Oral Daily Alessandra Bevels, MD   25 mg at 12/19/20 1021  . vitamin B-12 (CYANOCOBALAMIN) tablet 1,000 mcg  1,000 mcg Oral Daily Alessandra Bevels, MD   1,000 mcg at 12/19/20 1022  . warfarin (COUMADIN) tablet 5 mg  5 mg Oral ONCE-1600 Danford, Earl Lites, MD      . Warfarin - Pharmacist Dosing Inpatient   Does not apply q1600 Desiree Hane [facility]         Discharge Medications: Please see discharge summary for a list of discharge medications.  Relevant Imaging Results:  Relevant Lab Results:   Additional Information SS#: [id number]  Baldemar Lenis, LCSW

## 2020-12-19 NOTE — Progress Notes (Addendum)
STROKE TEAM PROGRESS NOTE   SUBJECTIVE (INTERVAL HISTORY) Her RN is at the bedside.  Overall her condition is gradually improving. Pt awake alert and orientated, however, she can not tell me why she is in hospital, she has no insight about current event that led her admission. Per RN, she was not taking her lactulose at NH for her chronic hyperammonemia. On admission, her ammonia level was 115. Since restarted lactulose after admission, her mental status is improving. Pt refused MRI again today. Will repeat CT.    OBJECTIVE Temp:  [97.7 F (36.5 C)-99 F (37.2 C)] 98 F (36.7 C) (04/14 2348) Pulse Rate:  [77-115] 77 (04/14 2348) Cardiac Rhythm: Normal sinus rhythm (04/14 1900) Resp:  [16-21] 17 (04/14 2348) BP: (99-129)/(49-85) 99/51 (04/14 2348) SpO2:  [95 %-100 %] 96 % (04/14 2348)  Recent Labs  Lab 12/18/20 1224 12/19/20 0717 12/19/20 1326 12/19/20 1702 12/19/20 2127  GLUCAP 135* 131* 70 171* 159*   Recent Labs  Lab 12/18/20 1221 12/18/20 1231  NA 142 142  K 4.0 3.9  CL 98 97*  CO2 23  --   GLUCOSE 140* 130*  BUN 17 18  CREATININE 1.36* 1.00  CALCIUM 8.1*  --    Recent Labs  Lab 12/18/20 1221  AST 52*  ALT 22  ALKPHOS 197*  BILITOT 3.3*  PROT 6.1*  ALBUMIN 2.4*   Recent Labs  Lab 12/18/20 1221 12/18/20 1231  WBC 8.8  --   NEUTROABS 5.3  --   HGB 12.7 13.3  HCT 40.0 39.0  MCV 103.9*  --   PLT 237  --    No results for input(s): CKTOTAL, CKMB, CKMBINDEX, TROPONINI in the last 168 hours. Recent Labs    12/18/20 1221 12/19/20 0234  LABPROT 26.7* 29.0*  INR 2.5* 2.7*   Recent Labs    12/18/20 1403  COLORURINE STRAW*  LABSPEC 1.014  PHURINE 5.0  GLUCOSEU NEGATIVE  HGBUR NEGATIVE  BILIRUBINUR NEGATIVE  KETONESUR NEGATIVE  PROTEINUR NEGATIVE  NITRITE NEGATIVE  LEUKOCYTESUR NEGATIVE       Component Value Date/Time   CHOL 84 12/19/2020 0234   TRIG 50 12/19/2020 0234   HDL 12 (L) 12/19/2020 0234   CHOLHDL 7.0 12/19/2020 0234   VLDL 10  12/19/2020 0234   LDLCALC 62 12/19/2020 0234   Lab Results  Component Value Date   HGBA1C 5.8 (H) 12/19/2020   No results found for: LABOPIA, COCAINSCRNUR, LABBENZ, AMPHETMU, THCU, LABBARB  No results for input(s): ETH in the last 168 hours.  I have personally reviewed the radiological images below and agree with the radiology interpretations.  EEG adult  Result Date: 12/18/2020 [name], MD     12/18/2020  7:30 PM HISTORY:  75 year old Female presents with altered mental status. INTRODUCTION:  A digital EEG was performed in the laboratory using the standard international 10/20 system of electrode placement in addition to one channel of EKG monitoring.  Hyperventilation and Photic Stimulation were not performed.  This tracing captures the patient in the awake and drowsy sleep states.  DESCRIPTION OF RECORD:  In the awake state there is diffuse slowing of the background consisting of 4 Hz frequency activity.  There is spontaneous variability seen with increase in background frequency intermittently.  Continuous generalized slowing mainly in the delta range is seen throughout the record.  There were also triphasic waves seen with an anterior-posterior gradient.  Occasionally, these occur on a regularly but only for 6-10 seconds.  At times, they  are sharply contoured and therefore, generalized sharp waves in a GPED pattern cannot be excluded. Heart rate is 120 bpm. IMPRESSION:  This is an abnormal adult EEG in the awake and drowsy states.  The slowing of the background rhythm, continuous generalized slowing, and triphasic waves are consistent with an underlying moderate to severe encephalopathy.  Alternatively, it could be due to the effect of sedative medications or bilateral cerebral dysfunction.  The GPEDs are most consistent with a severe metabolic, toxic, infectious encephalopathy because they do not last long enough to meet criteria for electrographic seizure. This does not rule out the  diagnosis of epilepsy.  If clinically indicated a repeat EEG or 24 hour EEG may be helpful. Clinical correlation is recommended.   ECHOCARDIOGRAM COMPLETE  Result Date: 12/19/2020    ECHOCARDIOGRAM REPORT   Patient Name:   DOREAN HIEBERT Date of Exam: 12/19/2020 Medical Rec #:  [id number]   Height:       62.0 in Accession #:    [id number]  Weight:       189.0 lb Date of Birth:  02-01-1946    BSA:          1.866 m Patient Age:    74 years    BP:           100/52 mmHg Patient Gender: F           HR:           107 bpm. Exam Location:  Inpatient Procedure: 2D Echo, Cardiac Doppler and Color Doppler Indications:    CVA  History:        Patient has no prior history of Echocardiogram examinations.                 Arrythmias:Atrial Fibrillation; Risk Factors:Hypertension and                 Diabetes.  Sonographer:    Neomia Dear RDCS Referring Phys: [id number] Reyne Dumas [facility]  Sonographer Comments: No cardiac surgery or porcedure noted in Epic IMPRESSIONS  1. Left ventricular ejection fraction, by estimation, is 55 to 60%. The left ventricle has normal function. The left ventricle has no regional wall motion abnormalities. Left ventricular diastolic parameters are indeterminate.  2. Right ventricular systolic function is mildly reduced. The right ventricular size is mildly enlarged. There is normal pulmonary artery systolic pressure.  3. Left atrial size was severely dilated.  4. Right atrial size was moderately dilated.  5. The mitral valve is normal in structure. Trivial mitral valve regurgitation. No evidence of mitral stenosis.  6. The aortic valve is tricuspid. Aortic valve regurgitation is trivial. Mild aortic valve sclerosis is present, with no evidence of aortic valve stenosis.  7. The patient appears to be in atrial fibrillation.  8. There is a pleural effusion. FINDINGS  Left Ventricle: Left ventricular ejection fraction, by estimation, is 55 to 60%. The left ventricle has normal function. The left ventricle has no  regional wall motion abnormalities. The left ventricular internal cavity size was normal in size. There is  no left ventricular hypertrophy. Left ventricular diastolic parameters are indeterminate. Right Ventricle: The right ventricular size is mildly enlarged. No increase in right ventricular wall thickness. Right ventricular systolic function is mildly reduced. There is normal pulmonary artery systolic pressure. The tricuspid regurgitant velocity  is 2.76 m/s, and with an assumed right atrial pressure of 3 mmHg, the estimated right ventricular systolic pressure is 33.5 mmHg. Left Atrium: Left atrial size was severely dilated.  Right Atrium: Right atrial size was moderately dilated. Pericardium: There is a pleural effusion. There is no evidence of pericardial effusion. Mitral Valve: The mitral valve is normal in structure. There is mild calcification of the mitral valve leaflet(s). Mild mitral annular calcification. Trivial mitral valve regurgitation. No evidence of mitral valve stenosis. MV peak gradient, 7.3 mmHg. The mean mitral valve gradient is 4.0 mmHg. Tricuspid Valve: The tricuspid valve is normal in structure. Tricuspid valve regurgitation is mild. Aortic Valve: The aortic valve is tricuspid. Aortic valve regurgitation is trivial. Mild aortic valve sclerosis is present, with no evidence of aortic valve stenosis. Aortic valve mean gradient measures 5.0 mmHg. Aortic valve peak gradient measures 9.0 mmHg. Aortic valve area, by VTI measures 1.86 cm. Pulmonic Valve: The pulmonic valve was normal in structure. Pulmonic valve regurgitation is not visualized. Aorta: The aortic root is normal in size and structure. IAS/Shunts: No atrial level shunt detected by color flow Doppler.  LEFT VENTRICLE PLAX 2D LVIDd:         5.00 cm LVIDs:         3.40 cm LV PW:         1.10 cm LV IVS:        0.90 cm LVOT diam:     2.00 cm LV SV:         57 LV SV Index:   30 LVOT Area:     3.14 cm  LV Volumes (MOD) LV vol d, MOD A2C:  56.5 ml LV vol d, MOD A4C: 58.7 ml LV vol s, MOD A2C: 25.0 ml LV vol s, MOD A4C: 24.6 ml LV SV MOD A2C:     31.5 ml LV SV MOD A4C:     58.7 ml LV SV MOD BP:      32.7 ml RIGHT VENTRICLE RV S prime:     8.38 cm/s TAPSE (M-mode): 1.7 cm LEFT ATRIUM              Index       RIGHT ATRIUM           Index LA diam:        4.50 cm  2.41 cm/m  RA Area:     28.30 cm LA Vol (A2C):   100.0 ml 53.59 ml/m RA Volume:   105.00 ml 56.27 ml/m LA Vol (A4C):   120.0 ml 64.30 ml/m LA Biplane Vol: 119.0 ml 63.77 ml/m  AORTIC VALVE                    PULMONIC VALVE AV Area (Vmax):    2.16 cm     PV Vmax:       1.10 m/s AV Area (Vmean):   1.96 cm     PV Vmean:      85.100 cm/s AV Area (VTI):     1.86 cm     PV VTI:        0.224 m AV Vmax:           150.00 cm/s  PV Peak grad:  4.8 mmHg AV Vmean:          110.000 cm/s PV Mean grad:  3.0 mmHg AV VTI:            0.304 m AV Peak Grad:      9.0 mmHg AV Mean Grad:      5.0 mmHg LVOT Vmax:         103.00 cm/s LVOT Vmean:  68.600 cm/s LVOT VTI:          0.180 m LVOT/AV VTI ratio: 0.59  AORTA Ao Root diam: 3.20 cm Ao Asc diam:  3.50 cm MITRAL VALVE                 TRICUSPID VALVE MV Area (PHT): 3.77 cm      TR Peak grad:   30.5 mmHg MV Area VTI:   3.07 cm      TR Vmax:        276.00 cm/s MV Peak grad:  7.3 mmHg MV Mean grad:  4.0 mmHg      SHUNTS MV Vmax:       1.35 m/s      Systemic VTI:  0.18 m MV Vmean:      95.0 cm/s     Systemic Diam: 2.00 cm MV Decel Time: 201 msec MR Peak grad:    87.2 mmHg MR Mean grad:    61.0 mmHg MR Vmax:         467.00 cm/s MR Vmean:        369.0 cm/s MR PISA:         1.01 cm MR PISA Eff ROA: 5 mm MR PISA Radius:  0.40 cm MV E velocity: 98.10 cm/s Marca Ancona MD Electronically signed by Marca Ancona MD Signature Date/Time: 12/19/2020/5:18:31 PM    Final    CT HEAD CODE STROKE WO CONTRAST  Addendum Date: 12/18/2020   ADDENDUM REPORT: 12/18/2020 12:50 ADDENDUM: These results were communicated to Dr. Amada Jupiter At 12:40 pmon 4/13/2022by text page  via the [facility] messaging system. Electronically Signed   By: Jackey Loge DO   On: 12/18/2020 12:50   Result Date: 12/18/2020 CLINICAL DATA:  Code stroke. Neuro deficit, acute, stroke suspected. EXAM: CT HEAD WITHOUT CONTRAST TECHNIQUE: Contiguous axial images were obtained from the base of the skull through the vertex without intravenous contrast. COMPARISON:  No pertinent prior exams available for comparison. FINDINGS: Brain: Mild cerebral atrophy. There is no acute intracranial hemorrhage. No demarcated cortical infarct. No extra-axial fluid collection. No evidence of intracranial mass. No midline shift. Partially empty sella turcica. Vascular: No hyperdense vessel.  Atherosclerotic calcifications. Skull: Normal. Negative for fracture or focal lesion. Sinuses/Orbits: Visualized orbits show no acute finding. Mild bilateral ethmoid sinus mucosal thickening. ASPECTS [facility] Stroke Program Early CT Score) - Ganglionic level infarction (caudate, lentiform nuclei, internal capsule, insula, M1-M3 cortex): 7 - Supraganglionic infarction (M4-M6 cortex): 3 Total score (0-10 with 10 being normal): 10 IMPRESSION: No evidence of acute intracranial abnormality.  ASPECTS is 10. Electronically Signed: By: Jackey Loge DO On: 12/18/2020 12:44   CT ANGIO HEAD NECK W WO CM (CODE STROKE)  Result Date: 12/18/2020 CLINICAL DATA:  Neuro deficit, acute, stroke suspected. EXAM: CT HEAD WITHOUT CONTRAST CT ANGIOGRAPHY HEAD AND NECK CT PERFUSION BRAIN TECHNIQUE: Multidetector CT imaging of the head and neck was performed using the standard protocol during bolus administration of intravenous contrast. Multiplanar CT image reconstructions and MIPs were obtained to evaluate the vascular anatomy. Carotid stenosis measurements (when applicable) are obtained utilizing NASCET criteria, using the distal internal carotid diameter as the denominator. Multiphase CT imaging of the brain was performed following IV bolus contrast injection.  Subsequent parametric perfusion maps were calculated using RAPID software. CONTRAST:  44mL OMNIPAQUE IOHEXOL 350 MG/ML SOLN COMPARISON:  Noncontrast head CT performed earlier today 12/18/2020. FINDINGS: CTA NECK FINDINGS Aortic arch: Standard aortic branching. Mild atherosclerotic plaque within the visualized aortic arch. No hemodynamically significant  innominate or proximal subclavian artery stenosis. Right carotid system: CCA and ICA patent within the neck. Calcified plaque within the mid to distal CCA resulting in less than 50% stenosis. Left carotid system: CCA and ICA patent within the neck without stenosis. Minimal soft and calcified plaque within the CCA. Partially retropharyngeal course of the cervical ICA Vertebral arteries: The vertebral arteries are developmentally diminutive, but patent. Moderate stenosis within the proximal left V1 segment. Skeleton: Cervical spondylosis with multilevel spinal canal and neural foraminal narrowing. Partially imaged thoracic levocurvature. No acute bony abnormality or aggressive osseous lesion. Other neck: No neck mass or cervical lymphadenopathy. Thyroid unremarkable. Upper chest: Nodules within the bilateral lung apices measuring up to 5 mm. Review of the MIP images confirms the above findings CTA HEAD FINDINGS Anterior circulation: The intracranial internal carotid arteries are patent. Calcified plaqu. E within both vessels with no more than mild stenosis The M1 middle cerebral arteries are patent. No M2 proximal branch occlusion or high-grade proximal stenosis is identified. The anterior cerebral arteries are patent with distal branch atherosclerotic irregularity but no significant proximal stenosis. No intracranial aneurysm is identified. Posterior circulation: The intracranial vertebral arteries are patent with mild atherosclerotic irregularity and sites of mild stenosis. The basilar artery is patent. The posterior cerebral arteries are patent. The P1 segments are  hypoplastic bilaterally with sizable bilateral posterior communicating arteries. Venous sinuses: Within the limitations of contrast timing, no convincing thrombus. Anatomic variants: As described Review of the MIP images confirms the above findings No emergent large vessel occlusion. These results were communicated to [facility] at 1:30 pmon 4/13/2022by text page via the [facility] messaging system. IMPRESSION: CTA neck: 1. Common and internal carotid arteries are patent within the neck. Atherosclerotic plaque within the mid to distal right CCA results in less than 50% stenosis. Minimal plaque within the left CCA. 2. The vertebral arteries are developmentally diminutive, but patent. Moderate stenosis of the proximal left V1 segment. 3. Multiple pulmonary nodules within the bilateral lung apices measuring up to 5 mm. No follow-up needed if patient is low-risk (and has no known or suspected primary neoplasm). Non-contrast chest CT can be considered in 12 months if patient is high-risk. This recommendation follows the consensus statement: Guidelines for Management of Incidental Pulmonary Nodules Detected on CT Images: From the Fleischner Society 2017; Radiology 2017; 284:228-243. CTA head: 1. No intracranial large vessel occlusion or proximal high-grade arterial stenosis. 2. Distal ACA branch atherosclerotic irregularity bilaterally. 3. Sites of mild atherosclerotic narrowing within the V4 vertebral arteries bilaterally. Electronically Signed   By: Jackey Loge DO   On: 12/18/2020 13:32     PHYSICAL EXAM  Temp:  [97.7 F (36.5 C)-99 F (37.2 C)] 98 F (36.7 C) (04/14 2348) Pulse Rate:  [77-115] 77 (04/14 2348) Resp:  [16-21] 17 (04/14 2348) BP: (99-129)/(49-85) 99/51 (04/14 2348) SpO2:  [95 %-100 %] 96 % (04/14 2348)  General - Well nourished, well developed, in no apparent distress.  Ophthalmologic - fundi not visualized due to noncooperation.  Cardiovascular - Regular rhythm and rate.  Mental Status  -  Level of arousal and orientation to time, place, and person were intact. Language including expression, naming, repetition, comprehension was assessed and found intact.  Cranial Nerves II - XII - II - Visual field intact OU. III, IV, VI - Extraocular movements intact. V - Facial sensation intact bilaterally. VII - Facial movement intact bilaterally. VIII - Hearing & vestibular intact bilaterally. X - Palate elevates symmetrically. XI - Chin turning & shoulder  shrug intact bilaterally. XII - Tongue protrusion intact.  Motor Strength - The patient's strength was normal in all extremities and pronator drift was absent.  Bulk was normal and fasciculations were absent.   Motor Tone - Muscle tone was assessed at the neck and appendages and was normal.  Reflexes - The patient's reflexes were symmetrical in all extremities and she had no pathological reflexes.  Sensory - Light touch, temperature/pinprick were assessed and were symmetrical.    Coordination - The patient had normal movements in the hands with no ataxia or dysmetria.  Tremor was absent.  Gait and Station - deferred.   ASSESSMENT/PLAN Ms. Denice ParadiseKay Tamas is a 75 y.o. female with history of A. fib on Coumadin, left lower extremity cellulitis, thrombocytopenia, hypertension, CKD, diabetes, CHF and CAD admitted for episode of aphasia, right-sided gaze, left-sided weakness. No tPA given due to On Coumadin, outside window.    Likely encephalopathy due to hyperammonemia  EEG the slowing of the background rhythm, continuous generalized slowing, and triphasic waves are consistent with an underlying moderate to severe encephalopathy  CT no acute abnormality  CT head and neck no LVO  MRI patient refused  Repeat CT pending  2D Echo EF 55 to 60%  LDL 62  HgbA1c 5.8  INR 2.5->2.7  Warfarin for VTE prophylaxis  warfarin daily prior to admission, now on warfarin daily.  Continue Coumadin on discharge.  INR goal 2-3.  Patient  counseled to be compliant with her antithrombotic medications  Ongoing aggressive stroke risk factor management  Therapy recommendations: SNF  Disposition: Pending  Hyperammonemia  Chronic hyperammonia on lactulose PTA  Noncompliance with lactulose at nursing home  Ammonia level 115  On lactulose  Clinically improved  Chronic A. Fib  On Coumadin PTA  INR 2.5-> 2.7  Continue Coumadin for stroke prevention  Hypertension . Stable  Long term BP goal normotensive  Other Stroke Risk Factors  Advanced age  Coronary artery disease  Other Active Problems  Left LE cellulitis - on dressing  Hospital day # 0   Marvel PlanJindong Brix Brearley, MD PhD Stroke Neurology 12/19/2020 11:53 PM    To contact Stroke Continuity provider, please refer to [email or web address]. After hours, contact General Neurology

## 2020-12-19 NOTE — Evaluation (Signed)
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 Noted several recent admissions to [facility] for LLE cellulitis, sepsis and dehyrdation/aki. CT negative, MRI pending. EEG abnormal with neurology recommending repeat or 24hr EEG. Admitted with TIA vs hepatic encephalopathy versus atypical seizures.   Clinical Impression   PTA patient reports at [facility] using RW for short distance mobility with assistance, completing ADLs with some assist.  Patient reports being in and out of the hospital for several months, but prior to initial hospitalization was home with her spouse. She was admitted for above and limited by problem list below, including mild L sided weakness, impaired cognition, decreased activity tolerance and impaired balance.  Patient is oriented during session, follows commands with increased time, but noted decreased problem solving and awareness to deficits; she is hyper-focused and agitated as reports she does not want to be at [facility], she needs to be at [facility].  She requires total assist +2 for bed level toileting (incontinent BM), max assist for LB ADLs and min assist for UB ADLS; mod assist for bed mobility and min assist for transfers +2 safety.  Patient will benefit from further OT services while admitted and after dc at SNF Level to optimize independence with ADLs, mobility.      Follow Up Recommendations  SNF;Supervision/Assistance - 24 hour    Equipment Recommendations  Other (comment) (TBD at next venue of care)    Recommendations for Other Services       Precautions / Restrictions Precautions Precautions:  Fall Precaution Comments: sacral wound, L LE wrapped Restrictions Weight Bearing Restrictions: No      Mobility Bed Mobility Overal bed mobility: Needs Assistance Bed Mobility: Rolling;Sit to Sidelying;Supine to Sit Rolling: Min assist   Supine to sit: Mod assist;HOB elevated   Sit to sidelying: Mod assist General bed mobility comments: patient rolling during hygiene with min assist to L and R, mod assist to EOB for trunk support to ascend and BLE support back to sidelying    Transfers Overall transfer level: Needs assistance Equipment used: Rolling walker (2 wheeled) Transfers: Sit to/from Stand Sit to Stand: Min assist;+2 safety/equipment         General transfer comment: min assist to power up and steady from EOB    Balance Overall balance assessment: Needs assistance Sitting-balance support: No upper extremity supported;Feet supported Sitting balance-Leahy Scale: Fair     Standing balance support: Bilateral upper extremity supported;During functional activity Standing balance-Leahy Scale: Poor Standing balance comment: relies on BUE and external support                           ADL either performed or assessed with clinical judgement   ADL Overall ADL's : Needs assistance/impaired     Grooming: Set up;Sitting   Upper Body Bathing: Minimal assistance;Sitting   Lower Body Bathing: Maximal assistance;Sit to/from stand   Upper Body Dressing : Minimal assistance;Sitting   Lower Body Dressing: Sit to/from stand;Maximal assistance;+2 for safety/equipment     Toilet Transfer Details (indicate cue type and reason): min assist RW side stepping towards [facility] Toileting- Clothing Manipulation and Hygiene: Total assistance;+2 for safety/equipment;+2 for physical assistance;Bed level Toileting - Clothing  Manipulation Details (indicate cue type and reason): due to incontient BM bed level with linen change required     Functional mobility during ADLs: Minimal  assistance;Moderate assistance;+2 for safety/equipment;Rolling walker General ADL Comments: pt limited by weakness, impaired balance, decreased activity tolerance and cognition     Vision   Vision Assessment?: No apparent visual deficits     Perception     Praxis      Pertinent Vitals/Pain Pain Assessment: Faces Faces Pain Scale: Hurts even more Pain Location: low back, L LE Pain Descriptors / Indicators: Discomfort;Dull Pain Intervention(s): Limited activity within patient's tolerance;Monitored during session;Repositioned     Hand Dominance Right   Extremity/Trunk Assessment Upper Extremity Assessment Upper Extremity Assessment: LUE deficits/detail;Generalized weakness LUE Deficits / Details: L UE slightly weaker than R UE, grossly 3-/5 MMT; sensation WFL LUE Sensation: WNL LUE Coordination: WNL   Lower Extremity Assessment Lower Extremity Assessment: Defer to PT evaluation       Communication Communication Communication: No difficulties   Cognition Arousal/Alertness: Awake/alert Behavior During Therapy: Agitated Overall Cognitive Status: Impaired/Different from baseline Area of Impairment: Following commands;Awareness;Problem solving                       Following Commands: Follows one step commands consistently;Follows one step commands with increased time;Follows multi-step commands inconsistently   Awareness: Emergent Problem Solving: Slow processing;Decreased initiation;Difficulty sequencing;Requires verbal cues General Comments: patient with decreased awareness of deficits, agitated during session due to "being at [place]", unaware of incontinence in bed.  Pt oriented and following commands with increased time.   General Comments  HR up to 135 sitting EOB, up to 161 standing at EOB    Exercises     Shoulder Instructions      Home Living Family/patient expects to be discharged to:: Skilled nursing facility                                         Prior Functioning/Environment Level of Independence: Needs assistance  Gait / Transfers Assistance Needed: reports using RW with assist for limited moblity at SNF ADL's / Homemaking Assistance Needed: reports needing some assist for ADLs at SNF   Comments: prior to initial hospitalization, pt at home with spouse        OT Problem List: Decreased strength;Decreased activity tolerance;Impaired balance (sitting and/or standing);Decreased cognition;Decreased safety awareness;Decreased knowledge of use of DME or AE;Decreased knowledge of precautions;Pain;Obesity      OT Treatment/Interventions: Self-care/ADL training;DME and/or AE instruction;Therapeutic activities;Cognitive remediation/compensation;Patient/family education;Balance training;Neuromuscular education    OT Goals(Current goals can be found in the care plan section) Acute Rehab OT Goals Patient Stated Goal: back to rehab OT Goal Formulation: With patient Time For Goal Achievement: 01/02/21 Potential to Achieve Goals: Good  OT Frequency: Min 2X/week   Barriers to D/C:            Co-evaluation PT/OT/SLP Co-Evaluation/Treatment: Yes Reason for Co-Treatment: For patient/therapist safety;To address functional/ADL transfers   OT goals addressed during session: ADL's and self-care      AM-PAC OT "6 Clicks" Daily Activity     Outcome Measure Help from another person eating meals?: A Little Help from another person taking care of personal grooming?: A Little Help from another person toileting, which includes using toliet, bedpan, or urinal?: Total Help from another person bathing (including washing, rinsing, drying)?: A Lot Help from another person to put  on and taking off regular upper body clothing?: A Little Help from another person to put on and taking off regular lower body clothing?: A Lot 6 Click Score: 14   End of Session Equipment Utilized During Treatment: Rolling walker Nurse  Communication: Mobility status  Activity Tolerance: Patient tolerated treatment well Patient left: in bed;with call bell/phone within reach;with nursing/sitter in room  OT Visit Diagnosis: Other abnormalities of gait and mobility (R26.89);Muscle weakness (generalized) (M62.81);Pain;Other symptoms and signs involving cognitive function Pain - Right/Left: Left Pain - part of body: Leg (back)                Time: 7793-9030 OT Time Calculation (min): 43 min Charges:  OT General Charges $OT Visit: 1 Visit OT Evaluation $OT Eval Moderate Complexity: 1 Mod OT Treatments $Self Care/Home Management : 8-22 mins  Kari Day, OT Acute Rehabilitation Services Pager [phone] Office [phone]   Kari Day 12/19/2020, 10:14 AM

## 2020-12-19 NOTE — Consult Note (Signed)
WOC Nurse Consult Note: Patient receiving care in [facility] 3W07. Result of COVID test pending. Reason for Consult: LLE wound and sacral wound Wound type: Per the night shift RN, the wound of the "sacrum" is actually on the right upper buttock.  She describes the area as quarter size, with an open central area that is dime size and pink.  The LLE is "wrapped". Pressure Injury POA: Yes/No/NA Measurement: Wound bed: Drainage (amount, consistency, odor)  Periwound: Dressing procedure/placement/frequency: Patient is being followed by [facility] and ID for the LLE wound.  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  I have mirrored the care to the wound documented by the surgeon on 11/12/20.  Wound care instructions: Left lower extremity wound   Clean with saline. Apply Sween Moisture Barrier Ointment to intact skin around wound.  Gently pack with NS moistened gauze. Be sure to pack in undermined areas of wound. Cover with ABD pads. Wrap with Kerlix and Ace wrap.   Change Qshift and prn drainage   Keep elevated with heel floated    As for the right buttock wound, clean with soap and water, place a foam dressing over the area, change daily.  WOC nurse will not follow at this time.  Please re-consult the WOC team if needed.  Helmut Muster, RN, MSN, CWOCN, CNS-BC, pager [phone]

## 2020-12-19 NOTE — Progress Notes (Incomplete)
Echocardiogram completed.

## 2020-12-20 DIAGNOSIS — E119 Type 2 diabetes mellitus without complications: Secondary | ICD-10-CM

## 2020-12-20 DIAGNOSIS — G459 Transient cerebral ischemic attack, unspecified: Secondary | ICD-10-CM | POA: Diagnosis not present

## 2020-12-20 LAB — CBC
HCT: 30.6 % — ABNORMAL LOW (ref 36.0–46.0)
Hemoglobin: 9.8 g/dL — ABNORMAL LOW (ref 12.0–15.0)
MCH: 32.8 pg (ref 26.0–34.0)
MCHC: 32 g/dL (ref 30.0–36.0)
MCV: 102.3 fL — ABNORMAL HIGH (ref 80.0–100.0)
Platelets: 142 10*3/uL — ABNORMAL LOW (ref 150–400)
RBC: 2.99 MIL/uL — ABNORMAL LOW (ref 3.87–5.11)
RDW: 16.9 % — ABNORMAL HIGH (ref 11.5–15.5)
WBC: 6.4 10*3/uL (ref 4.0–10.5)
nRBC: 0 % (ref 0.0–0.2)

## 2020-12-20 LAB — COMPREHENSIVE METABOLIC PANEL
ALT: 17 U/L (ref 0–44)
AST: 57 U/L — ABNORMAL HIGH (ref 15–41)
Albumin: 1.7 g/dL — ABNORMAL LOW (ref 3.5–5.0)
Alkaline Phosphatase: 154 U/L — ABNORMAL HIGH (ref 38–126)
Anion gap: 8 (ref 5–15)
BUN: 13 mg/dL (ref 8–23)
CO2: 29 mmol/L (ref 22–32)
Calcium: 6.9 mg/dL — ABNORMAL LOW (ref 8.9–10.3)
Chloride: 102 mmol/L (ref 98–111)
Creatinine, Ser: 1.05 mg/dL — ABNORMAL HIGH (ref 0.44–1.00)
GFR, Estimated: 55 mL/min — ABNORMAL LOW (ref >60)
Glucose, Bld: 104 mg/dL — ABNORMAL HIGH (ref 70–99)
Potassium: 3 mmol/L — ABNORMAL LOW (ref 3.5–5.1)
Sodium: 139 mmol/L (ref 135–145)
Total Bilirubin: 1.7 mg/dL — ABNORMAL HIGH (ref 0.3–1.2)
Total Protein: 4.5 g/dL — ABNORMAL LOW (ref 6.5–8.1)

## 2020-12-20 LAB — GLUCOSE, CAPILLARY
Glucose-Capillary: 99 mg/dL (ref 70–99)
Glucose-Capillary: 98 mg/dL (ref 70–99)
Glucose-Capillary: 128 mg/dL — ABNORMAL HIGH (ref 70–99)

## 2020-12-20 LAB — PROTIME-INR
INR: 3.6 — ABNORMAL HIGH (ref 0.8–1.2)
Prothrombin Time: 36.1 seconds — ABNORMAL HIGH (ref 11.4–15.2)

## 2020-12-20 MED ORDER — ATORVASTATIN CALCIUM 40 MG PO TABS: 40.0000 mg | ORAL_TABLET | Freq: Every day | ORAL | 0 refills | Status: AC

## 2020-12-20 MED ORDER — METOPROLOL TARTRATE 50 MG PO TABS: 50.0000 mg | ORAL_TABLET | Freq: Two times a day (BID) | ORAL | 0 refills | Status: AC

## 2020-12-20 MED ORDER — LACTULOSE 10 GM/15ML PO SOLN: 10.0000 g | Freq: Three times a day (TID) | ORAL | 0 refills | Status: AC

## 2020-12-20 MED ORDER — POTASSIUM CHLORIDE CRYS ER 20 MEQ PO TBCR
20.0000 meq | EXTENDED_RELEASE_TABLET | Freq: Two times a day (BID) | ORAL | Status: DC
  Administered 2020-12-20: 20 meq via ORAL
  Filled 2020-12-20: qty 1

## 2020-12-20 MED ORDER — POTASSIUM CHLORIDE CRYS ER 20 MEQ PO TBCR: 20.0000 meq | EXTENDED_RELEASE_TABLET | Freq: Two times a day (BID) | ORAL | 0 refills | Status: AC

## 2020-12-20 MED ORDER — WARFARIN SODIUM 5 MG PO TABS: 2.5000 mg | ORAL_TABLET | ORAL | 0 refills | Status: AC

## 2020-12-20 NOTE — Progress Notes (Signed)
STROKE TEAM PROGRESS NOTE   SUBJECTIVE (INTERVAL HISTORY) No family is at the bedside.  No acute event overnight and she is eager to go back to her facility. She has refused MRI or repeat CT.    OBJECTIVE Temp:  [98 F (36.7 C)-99 F (37.2 C)] 98 F (36.7 C) (04/14 2348) Pulse Rate:  [77-115] 79 (04/15 1042) Cardiac Rhythm: Atrial fibrillation (04/15 0700) Resp:  [16-21] 17 (04/14 2348) BP: (99-129)/(49-70) 106/56 (04/15 1042) SpO2:  [95 %-100 %] 96 % (04/14 2348)  Recent Labs  Lab 12/19/20 1326 12/19/20 1702 12/19/20 2127 12/20/20 0624 12/20/20 0817  GLUCAP 70 171* 159* 99 98   Recent Labs  Lab 12/18/20 1221 12/18/20 1231 12/20/20 0210  NA 142 142 139  K 4.0 3.9 3.0*  CL 98 97* 102  CO2 23  --  29  GLUCOSE 140* 130* 104*  BUN CREATININE 1.36* 1.00 1.05*  CALCIUM 8.1*  --  6.9*   Recent Labs  Lab 12/18/20 1221 12/20/20 0210  AST 52* 57*  ALT 22 17  ALKPHOS 197* 154*  BILITOT 3.3* 1.7*  PROT 6.1* 4.5*  ALBUMIN 2.4* 1.7*   Recent Labs  Lab 12/18/20 1221 12/18/20 1231 12/20/20 0210  WBC 8.8  --  6.4  NEUTROABS 5.3  --   --   HGB 12.7 13.3 9.8*  HCT 40.0 39.0 30.6*  MCV 103.9*  --  102.3*  PLT 237  --  142*   No results for input(s): CKTOTAL, CKMB, CKMBINDEX, TROPONINI in the last 168 hours. Recent Labs    12/18/20 1221 12/19/20 0234 12/20/20 0210  LABPROT 26.7* 29.0* 36.1*  INR 2.5* 2.7* 3.6*   Recent Labs    12/18/20 1403  COLORURINE STRAW*  LABSPEC 1.014  PHURINE 5.0  GLUCOSEU NEGATIVE  HGBUR NEGATIVE  BILIRUBINUR NEGATIVE  KETONESUR NEGATIVE  PROTEINUR NEGATIVE  NITRITE NEGATIVE  LEUKOCYTESUR NEGATIVE       Component Value Date/Time   CHOL 84 12/19/2020 0234   TRIG 50 12/19/2020 0234   HDL 12 (L) 12/19/2020 0234   CHOLHDL 7.0 12/19/2020 0234   VLDL 10 12/19/2020 0234   LDLCALC 62 12/19/2020 0234   Lab Results  Component Value Date   HGBA1C 5.8 (H) 12/19/2020   No results found for: LABOPIA, COCAINSCRNUR,  LABBENZ, AMPHETMU, THCU, LABBARB  No results for input(s): ETH in the last 168 hours.  I have personally reviewed the radiological images below and agree with the radiology interpretations.  EEG adult  Result Date: 12/18/2020 [name], MD     12/18/2020  7:30 PM HISTORY:  75 year old Female presents with altered mental status. INTRODUCTION:  A digital EEG was performed in the laboratory using the standard international 10/20 system of electrode placement in addition to one channel of EKG monitoring.  Hyperventilation and Photic Stimulation were not performed.  This tracing captures the patient in the awake and drowsy sleep states.  DESCRIPTION OF RECORD:  In the awake state there is diffuse slowing of the background consisting of 4 Hz frequency activity.  There is spontaneous variability seen with increase in background frequency intermittently.  Continuous generalized slowing mainly in the delta range is seen throughout the record.  There were also triphasic waves seen with an anterior-posterior gradient.  Occasionally, these occur on a regularly but only for 6-10 seconds.  At times, they are sharply contoured and therefore, generalized sharp waves in a GPED pattern cannot be excluded. Heart rate is 120 bpm.  IMPRESSION:  This is an abnormal adult EEG in the awake and drowsy states.  The slowing of the background rhythm, continuous generalized slowing, and triphasic waves are consistent with an underlying moderate to severe encephalopathy.  Alternatively, it could be due to the effect of sedative medications or bilateral cerebral dysfunction.  The GPEDs are most consistent with a severe metabolic, toxic, infectious encephalopathy because they do not last long enough to meet criteria for electrographic seizure. This does not rule out the diagnosis of epilepsy.  If clinically indicated a repeat EEG or 24 hour EEG may be helpful. Clinical correlation is recommended.   ECHOCARDIOGRAM  COMPLETE  Result Date: 12/19/2020    ECHOCARDIOGRAM REPORT   Patient Name:   Kari Day Date of Exam: 12/19/2020 Medical Rec #:  [id number]   Height:       62.0 in Accession #:    [id number]  Weight:       189.0 lb Date of Birth:  12/21/1945    BSA:          1.866 m Patient Age:    74 years    BP:           100/52 mmHg Patient Gender: F           HR:           107 bpm. Exam Location:  Inpatient Procedure: 2D Echo, Cardiac Doppler and Color Doppler Indications:    CVA  History:        Patient has no prior history of Echocardiogram examinations.                 Arrythmias:Atrial Fibrillation; Risk Factors:Hypertension and                 Diabetes.  Sonographer:    Neomia DearAMARA CROWN RDCS Referring Phys: [id number] Reyne DumasSTEVI W [facility], St[facility] Francis CampusOBERMAN  Sonographer Comments: No cardiac surgery or porcedure noted in Epic IMPRESSIONS  1. Left ventricular ejection fraction, by estimation, is 55 to 60%. The left ventricle has normal function. The left ventricle has no regional wall motion abnormalities. Left ventricular diastolic parameters are indeterminate.  2. Right ventricular systolic function is mildly reduced. The right ventricular size is mildly enlarged. There is normal pulmonary artery systolic pressure.  3. Left atrial size was severely dilated.  4. Right atrial size was moderately dilated.  5. The mitral valve is normal in structure. Trivial mitral valve regurgitation. No evidence of mitral stenosis.  6. The aortic valve is tricuspid. Aortic valve regurgitation is trivial. Mild aortic valve sclerosis is present, with no evidence of aortic valve stenosis.  7. The patient appears to be in atrial fibrillation.  8. There is a pleural effusion. FINDINGS  Left Ventricle: Left ventricular ejection fraction, by estimation, is 55 to 60%. The left ventricle has normal function. The left ventricle has no regional wall motion abnormalities. The left ventricular internal cavity size was normal in size. There is  no left ventricular hypertrophy. Left  ventricular diastolic parameters are indeterminate. Right Ventricle: The right ventricular size is mildly enlarged. No increase in right ventricular wall thickness. Right ventricular systolic function is mildly reduced. There is normal pulmonary artery systolic pressure. The tricuspid regurgitant velocity  is 2.76 m/s, and with an assumed right atrial pressure of 3 mmHg, the estimated right ventricular systolic pressure is 33.5 mmHg. Left Atrium: Left atrial size was severely dilated. Right Atrium: Right atrial size was moderately dilated. Pericardium: There is a pleural effusion. There is no evidence of pericardial  effusion. Mitral Valve: The mitral valve is normal in structure. There is mild calcification of the mitral valve leaflet(s). Mild mitral annular calcification. Trivial mitral valve regurgitation. No evidence of mitral valve stenosis. MV peak gradient, 7.3 mmHg. The mean mitral valve gradient is 4.0 mmHg. Tricuspid Valve: The tricuspid valve is normal in structure. Tricuspid valve regurgitation is mild. Aortic Valve: The aortic valve is tricuspid. Aortic valve regurgitation is trivial. Mild aortic valve sclerosis is present, with no evidence of aortic valve stenosis. Aortic valve mean gradient measures 5.0 mmHg. Aortic valve peak gradient measures 9.0 mmHg. Aortic valve area, by VTI measures 1.86 cm. Pulmonic Valve: The pulmonic valve was normal in structure. Pulmonic valve regurgitation is not visualized. Aorta: The aortic root is normal in size and structure. IAS/Shunts: No atrial level shunt detected by color flow Doppler.  LEFT VENTRICLE PLAX 2D LVIDd:         5.00 cm LVIDs:         3.40 cm LV PW:         1.10 cm LV IVS:        0.90 cm LVOT diam:     2.00 cm LV SV:         57 LV SV Index:   30 LVOT Area:     3.14 cm  LV Volumes (MOD) LV vol d, MOD A2C: 56.5 ml LV vol d, MOD A4C: 58.7 ml LV vol s, MOD A2C: 25.0 ml LV vol s, MOD A4C: 24.6 ml LV SV MOD A2C:     31.5 ml LV SV MOD A4C:     58.7 ml LV SV  MOD BP:      32.7 ml RIGHT VENTRICLE RV S prime:     8.38 cm/s TAPSE (M-mode): 1.7 cm LEFT ATRIUM              Index       RIGHT ATRIUM           Index LA diam:        4.50 cm  2.41 cm/m  RA Area:     28.30 cm LA Vol (A2C):   100.0 ml 53.59 ml/m RA Volume:   105.00 ml 56.27 ml/m LA Vol (A4C):   120.0 ml 64.30 ml/m LA Biplane Vol: 119.0 ml 63.77 ml/m  AORTIC VALVE                    PULMONIC VALVE AV Area (Vmax):    2.16 cm     PV Vmax:       1.10 m/s AV Area (Vmean):   1.96 cm     PV Vmean:      85.100 cm/s AV Area (VTI):     1.86 cm     PV VTI:        0.224 m AV Vmax:           150.00 cm/s  PV Peak grad:  4.8 mmHg AV Vmean:          110.000 cm/s PV Mean grad:  3.0 mmHg AV VTI:            0.304 m AV Peak Grad:      9.0 mmHg AV Mean Grad:      5.0 mmHg LVOT Vmax:         103.00 cm/s LVOT Vmean:        68.600 cm/s LVOT VTI:          0.180 m LVOT/AV VTI ratio: 0.59  AORTA Ao Root diam: 3.20 cm Ao Asc diam:  3.50 cm MITRAL VALVE                 TRICUSPID VALVE MV Area (PHT): 3.77 cm      TR Peak grad:   30.5 mmHg MV Area VTI:   3.07 cm      TR Vmax:        276.00 cm/s MV Peak grad:  7.3 mmHg MV Mean grad:  4.0 mmHg      SHUNTS MV Vmax:       1.35 m/s      Systemic VTI:  0.18 m MV Vmean:      95.0 cm/s     Systemic Diam: 2.00 cm MV Decel Time: 201 msec MR Peak grad:    87.2 mmHg MR Mean grad:    61.0 mmHg MR Vmax:         467.00 cm/s MR Vmean:        369.0 cm/s MR PISA:         1.01 cm MR PISA Eff ROA: 5 mm MR PISA Radius:  0.40 cm MV E velocity: 98.10 cm/s Marca Ancona MD Electronically signed by Marca Ancona MD Signature Date/Time: 12/19/2020/5:18:31 PM    Final    CT HEAD CODE STROKE WO CONTRAST  Addendum Date: 12/18/2020   ADDENDUM REPORT: 12/18/2020 12:50 ADDENDUM: These results were communicated to Dr. Amada Jupiter At 12:40 pmon 4/13/2022by text page via the [facility] messaging system. Electronically Signed   By: Jackey Loge DO   On: 12/18/2020 12:50   Result Date: 12/18/2020 CLINICAL DATA:  Code  stroke. Neuro deficit, acute, stroke suspected. EXAM: CT HEAD WITHOUT CONTRAST TECHNIQUE: Contiguous axial images were obtained from the base of the skull through the vertex without intravenous contrast. COMPARISON:  No pertinent prior exams available for comparison. FINDINGS: Brain: Mild cerebral atrophy. There is no acute intracranial hemorrhage. No demarcated cortical infarct. No extra-axial fluid collection. No evidence of intracranial mass. No midline shift. Partially empty sella turcica. Vascular: No hyperdense vessel.  Atherosclerotic calcifications. Skull: Normal. Negative for fracture or focal lesion. Sinuses/Orbits: Visualized orbits show no acute finding. Mild bilateral ethmoid sinus mucosal thickening. ASPECTS [facility] Stroke Program Early CT Score) - Ganglionic level infarction (caudate, lentiform nuclei, internal capsule, insula, M1-M3 cortex): 7 - Supraganglionic infarction (M4-M6 cortex): 3 Total score (0-10 with 10 being normal): 10 IMPRESSION: No evidence of acute intracranial abnormality.  ASPECTS is 10. Electronically Signed: By: Jackey Loge DO On: 12/18/2020 12:44   CT ANGIO HEAD NECK W WO CM (CODE STROKE)  Result Date: 12/18/2020 CLINICAL DATA:  Neuro deficit, acute, stroke suspected. EXAM: CT HEAD WITHOUT CONTRAST CT ANGIOGRAPHY HEAD AND NECK CT PERFUSION BRAIN TECHNIQUE: Multidetector CT imaging of the head and neck was performed using the standard protocol during bolus administration of intravenous contrast. Multiplanar CT image reconstructions and MIPs were obtained to evaluate the vascular anatomy. Carotid stenosis measurements (when applicable) are obtained utilizing NASCET criteria, using the distal internal carotid diameter as the denominator. Multiphase CT imaging of the brain was performed following IV bolus contrast injection. Subsequent parametric perfusion maps were calculated using RAPID software. CONTRAST:  59mL OMNIPAQUE IOHEXOL 350 MG/ML SOLN COMPARISON:  Noncontrast head  CT performed earlier today 12/18/2020. FINDINGS: CTA NECK FINDINGS Aortic arch: Standard aortic branching. Mild atherosclerotic plaque within the visualized aortic arch. No hemodynamically significant innominate or proximal subclavian artery stenosis. Right carotid system: CCA and ICA patent within the neck. Calcified plaque within the  mid to distal CCA resulting in less than 50% stenosis. Left carotid system: CCA and ICA patent within the neck without stenosis. Minimal soft and calcified plaque within the CCA. Partially retropharyngeal course of the cervical ICA Vertebral arteries: The vertebral arteries are developmentally diminutive, but patent. Moderate stenosis within the proximal left V1 segment. Skeleton: Cervical spondylosis with multilevel spinal canal and neural foraminal narrowing. Partially imaged thoracic levocurvature. No acute bony abnormality or aggressive osseous lesion. Other neck: No neck mass or cervical lymphadenopathy. Thyroid unremarkable. Upper chest: Nodules within the bilateral lung apices measuring up to 5 mm. Review of the MIP images confirms the above findings CTA HEAD FINDINGS Anterior circulation: The intracranial internal carotid arteries are patent. Calcified plaqu. E within both vessels with no more than mild stenosis The M1 middle cerebral arteries are patent. No M2 proximal branch occlusion or high-grade proximal stenosis is identified. The anterior cerebral arteries are patent with distal branch atherosclerotic irregularity but no significant proximal stenosis. No intracranial aneurysm is identified. Posterior circulation: The intracranial vertebral arteries are patent with mild atherosclerotic irregularity and sites of mild stenosis. The basilar artery is patent. The posterior cerebral arteries are patent. The P1 segments are hypoplastic bilaterally with sizable bilateral posterior communicating arteries. Venous sinuses: Within the limitations of contrast timing, no convincing  thrombus. Anatomic variants: As described Review of the MIP images confirms the above findings No emergent large vessel occlusion. These results were communicated to [facility] at 1:30 pmon 4/13/2022by text page via the [facility] messaging system. IMPRESSION: CTA neck: 1. Common and internal carotid arteries are patent within the neck. Atherosclerotic plaque within the mid to distal right CCA results in less than 50% stenosis. Minimal plaque within the left CCA. 2. The vertebral arteries are developmentally diminutive, but patent. Moderate stenosis of the proximal left V1 segment. 3. Multiple pulmonary nodules within the bilateral lung apices measuring up to 5 mm. No follow-up needed if patient is low-risk (and has no known or suspected primary neoplasm). Non-contrast chest CT can be considered in 12 months if patient is high-risk. This recommendation follows the consensus statement: Guidelines for Management of Incidental Pulmonary Nodules Detected on CT Images: From the Fleischner Society 2017; Radiology 2017; 284:228-243. CTA head: 1. No intracranial large vessel occlusion or proximal high-grade arterial stenosis. 2. Distal ACA branch atherosclerotic irregularity bilaterally. 3. Sites of mild atherosclerotic narrowing within the V4 vertebral arteries bilaterally. Electronically Signed   By: Jackey Loge DO   On: 12/18/2020 13:32     PHYSICAL EXAM  Temp:  [98 F (36.7 C)-99 F (37.2 C)] 98 F (36.7 C) (04/14 2348) Pulse Rate:  [77-115] 79 (04/15 1042) Resp:  [16-21] 17 (04/14 2348) BP: (99-129)/(49-70) 106/56 (04/15 1042) SpO2:  [95 %-100 %] 96 % (04/14 2348)  General - Well nourished, well developed, in no apparent distress.  Ophthalmologic - fundi not visualized due to noncooperation.  Cardiovascular - Regular rhythm and rate.  Mental Status -  Level of arousal and orientation to time, place, and person were intact. Language including expression, naming, repetition, comprehension was assessed  and found intact.  Cranial Nerves II - XII - II - Visual field intact OU. III, IV, VI - Extraocular movements intact. V - Facial sensation intact bilaterally. VII - Facial movement intact bilaterally. VIII - Hearing & vestibular intact bilaterally. X - Palate elevates symmetrically. XI - Chin turning & shoulder shrug intact bilaterally. XII - Tongue protrusion intact.  Motor Strength - The patient's strength was normal in all extremities  and pronator drift was absent.  Bulk was normal and fasciculations were absent.   Motor Tone - Muscle tone was assessed at the neck and appendages and was normal.  Reflexes - The patient's reflexes were symmetrical in all extremities and she had no pathological reflexes.  Sensory - Light touch, temperature/pinprick were assessed and were symmetrical.    Coordination - The patient had normal movements in the hands with no ataxia or dysmetria.  Tremor was absent.  Gait and Station - deferred.   ASSESSMENT/PLAN Ms. Melonee Gerstel is a 75 y.o. female with history of A. fib on Coumadin, left lower extremity cellulitis, thrombocytopenia, hypertension, CKD, diabetes, CHF and CAD admitted for episode of aphasia, right-sided gaze, left-sided weakness. No tPA given due to On Coumadin, outside window.    Likely encephalopathy due to hyperammonemia  EEG the slowing of the background rhythm, continuous generalized slowing, and triphasic waves are consistent with an underlying moderate to severe encephalopathy  CT no acute abnormality  CT head and neck no LVO  MRI patient refused  Repeat CT again refused  2D Echo EF 55 to 60%  LDL 62  HgbA1c 5.8  INR 2.5->2.7->3.6  Warfarin for VTE prophylaxis  warfarin daily prior to admission, now on warfarin daily.  Continue Coumadin on discharge.  INR goal 2-3.  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 Fib  On Coumadin PTA  INR 2.5-> 2.7->3.6  INR goal 2-3  Continue Coumadin for stroke prevention  Hypertension . Stable  Long term BP goal normotensive  Other Stroke Risk Factors  Advanced age  Coronary artery disease  Other Active Problems  Left LE cellulitis - on dressing  Hospital day # 0  Neurology will sign off. Please call with questions. No neuro follow up needed at this time. Thanks for the consult.   Marvel Plan, MD PhD Stroke Neurology 12/20/2020 11:07 AM    To contact Stroke Continuity provider, please refer to [email or web address]. After hours, contact General Neurology

## 2020-12-20 NOTE — Discharge Summary (Signed)
Physician Discharge Summary  Kari Day NWG:[id number] DOB: [birth year] DOA: 12/18/2020  PCP: Salli Quarry, PA-C  Admit date: 12/18/2020 Discharge date: 12/20/2020  Admitted From: [facility] Disposition:  home with home health  Recommendations for Outpatient Follow-up:  1. Follow up with PCP in 1-2 weeks 2. Please obtain BMP/CBC in one week 3. Please see change in Coumadin dosing.  Recommend PCP check INR on 4/20  Home Health: Yes Equipment/Devices: None  Discharge Condition: Stable CODE STATUS: Full code Diet recommendation: Heart healthy carb modified  Brief/Interim Summary: Kari Day is a 75 y.o. F with Elita Boone cirrhosis, hepatic encephalopathy on lactulose, history of ascites, not currently, paroxysmal A. fib on warfarin, IDDM, and hypothyroidism, who presented with confusion.  Had recently been admitted to [facility] for sepsis secondary to left leg cellulitis associated with Raoultella bacteremia, requiring ID consultation and debridement, discharged to SNF, completed 4 weeks Levaquin on 4/4.  In the few days prior to the current admission, family had noted she was confused to begin present trying to plug her phone into the lid of her drinking cup), and then eventually having slowed responses and not responding to questions and so she was sent by EMS to the ER.  In the ER, she had heart rate 140s and rapid A. fib, but electrolytes, WBC, INR all normal.  CT head unremarkable.  CTA head and neck unremarkable.  She was evaluated by neurology who felt that she clinically had hepatic encephalopathy, subsequent ammonia level was greater than 100.  Discharge Diagnoses:  Principal Problem:   TIA (transient ischemic attack) Active Problems:   Atrial fibrillation with RVR (HCC)   Diarrhea   Non-alcoholic micronodular cirrhosis of liver (HCC)   Encephalopathy, hepatic (HCC)   HTN (hypertension)   Hypothyroidism   Leg wound   Type 2 diabetes  mellitus (HCC)  Acute metabolic encephalopathy due to hepatic encephalopathy Family and patient report only symptoms of global mental slowing, and confusion consistent with hepatic encephalopathy. Daughter confirms that the patient was not being given her lactulose on a consistent basis at [facility]. -Continue lactulose resumed at 10 mg 3 times daily to decrease side effects, and there was improvement in her mentation.  Neurology had wanted follow-up scanning but she had declined this.  Atrial fibrillation, paroxysmal with RVR Acquired thrombophilia -Increase oral metoprolol -Continue warfarin--came in with initial INR in the 2 range, was increased to 3.6 on day of discharge.  Consultation with pharmacy advised change in dose to 2.5 mg Monday Wednesday Friday 5 mg Tuesday Thursday Saturday and Sunday.  She will have her Friday night dose held.  Compensated cirrhosis Bili 3.3, albumin low, no ascites.  HE improving as above. -Continue spironolactone -Resume furosdmie  Diabetes, type 2, well controlled, with NASH Glucoses normal. -Resume 70/30 -Continue statin -Resume home metformin  Hypothyroidism -Continue levothyroxine  GERD -Continue PPI  Discharge Instructions:  Discharge Instructions    Call MD for:  extreme fatigue   Complete by: As directed    Call MD for:  persistant dizziness or light-headedness   Complete by: As directed    Call MD for:  severe uncontrolled pain   Complete by: As directed    Call MD for:  temperature >100.4   Complete by: As directed    Diet - low sodium heart healthy   Complete by: As directed    Diet Carb Modified   Complete by: As directed    Discharge instructions   Complete by: As directed  No coumadin/warfarin today---begin new dosing in med list tomorrow.   Discharge wound care:   Complete by: As directed    Left lower extremity wound   Clean with saline. Apply Sween Moisture Barrier Ointment to intact skin around wound.   Gently pack with NS moistened gauze. Be sure to pack in undermined areas of wound. Cover with ABD pads. Wrap with Kerlix and Ace wrap.   Change Qshift and prn drainage   Keep elevated with heel floated    As for the right buttock wound, clean with soap and water, place a foam dressing over the area, change daily.   Increase activity slowly   Complete by: As directed      Allergies as of 12/20/2020      Reactions   Caffeine    Codeine    Dilaudid [hydromorphone]    Fish Allergy    Naproxen    Nsaids    Percocet [oxycodone-acetaminophen]    Shellfish Allergy Nausea And Vomiting   Sulfa Antibiotics       Medication List    TAKE these medications   albuterol 108 (90 Base) MCG/ACT inhaler Commonly known as: VENTOLIN HFA Inhale 2 puffs into the lungs every 4 (four) hours as needed for wheezing.   ALPRAZolam 0.5 MG tablet Commonly known as: XANAX Take 0.5 mg by mouth at bedtime as needed for sleep.   atorvastatin 40 MG tablet Commonly known as: LIPITOR Take 1 tablet (40 mg total) by mouth daily. Start taking on: December 21, 2020   clotrimazole-betamethasone cream Commonly known as: LOTRISONE Apply 1 application topically 2 (two) times daily. To affected area   diclofenac Sodium 1 % Gel Commonly known as: VOLTAREN Apply 2 g topically 4 (four) times daily. To upper neck and back.   diphenhydrAMINE 25 mg capsule Commonly known as: BENADRYL Take 25 mg by mouth every 6 (six) hours as needed for itching.   ferrous sulfate 325 (65 FE) MG tablet Take 325 mg by mouth daily with breakfast.   folic acid 1 MG tablet Commonly known as: FOLVITE Take 1 mg by mouth daily.   furosemide 80 MG tablet Commonly known as: LASIX Take 80 mg by mouth daily.   HUMULIN 70/30 KWIKPEN [place] Inject 28-30 Units into the skin See admin instructions. 30 units every morning and 28 units every evening   lactulose 10 GM/15ML solution Commonly known as: CHRONULAC Take 15 mLs (10 g total) by  mouth 3 (three) times daily. What changed: how much to take   levothyroxine 112 MCG tablet Commonly known as: SYNTHROID Take 112 mcg by mouth daily.   metFORMIN 850 MG tablet Commonly known as: GLUCOPHAGE Take 850 mg by mouth in the morning, at noon, and at bedtime.   metoprolol tartrate 50 MG tablet Commonly known as: LOPRESSOR Take 1 tablet (50 mg total) by mouth 2 (two) times daily. What changed:   medication strength  how much to take   nystatin ointment Commonly known as: MYCOSTATIN Apply 1 application topically in the morning, at noon, in the evening, and at bedtime. To the affected area   ondansetron 8 MG disintegrating tablet Commonly known as: ZOFRAN-ODT Take 8 mg by mouth every 8 (eight) hours as needed for nausea.   oxyCODONE 5 MG immediate release tablet Commonly known as: Oxy IR/ROXICODONE Take 5 mg by mouth every 4 (four) hours as needed for moderate pain.   pantoprazole 40 MG tablet Commonly known as: PROTONIX Take 40 mg by mouth daily.   polyethylene  glycol 17 g packet Commonly known as: MIRALAX / GLYCOLAX Take 17 g by mouth daily as needed for mild constipation.   potassium chloride 10 MEQ CR capsule Commonly known as: MICRO-K Take 10 mEq by mouth daily.   potassium chloride SA 20 MEQ tablet Commonly known as: KLOR-CON Take 1 tablet (20 mEq total) by mouth 2 (two) times daily for 3 days.   spironolactone 25 MG tablet Commonly known as: ALDACTONE Take 25 mg by mouth daily.   triamcinolone cream 0.1 % Commonly known as: KENALOG Apply 1 application topically 2 (two) times daily. To affected area   vitamin B-12 1000 MCG tablet Commonly known as: CYANOCOBALAMIN Take 1,000 mcg by mouth daily.   vitamin D3 50 MCG (2000 UT) Caps Take 1 capsule by mouth daily.   warfarin 5 MG tablet Commonly known as: COUMADIN Take 0.5-1 tablets (2.5-5 mg total) by mouth See admin instructions. 5mg  daily on Sunday, Tuesday, Thursday, and Saturday (Everyday  except 2.5 mg on Monday, Wednesday and Friday). What changed:   how much to take  additional instructions            Discharge Care Instructions  (From admission, onward)         Start     Ordered   12/20/20 0000  Discharge wound care:       Comments: Left lower extremity wound   Clean with saline. Apply Sween Moisture Barrier Ointment to intact skin around wound.  Gently pack with NS moistened gauze. Be sure to pack in undermined areas of wound. Cover with ABD pads. Wrap with Kerlix and Ace wrap.   Change Qshift and prn drainage   Keep elevated with heel floated    As for the right buttock wound, clean with soap and water, place a foam dressing over the area, change daily.   12/20/20 1117          Contact information for follow-up providers    Salli QuarryZimmermann, Matt, PA-C Follow up.   Specialty: Physician Assistant Contact information: [address] [place] [address] [phone]            Contact information for after-discharge care    Destination    [facility] [place] .   Service: Skilled Nursing Contact information: [address] [place] [id number] [phone]                 Allergies  Allergen Reactions  . Caffeine   . Codeine   . Dilaudid [Hydromorphone]   . Fish Allergy   . Naproxen   . Nsaids   . Percocet [Oxycodone-Acetaminophen]   . Shellfish Allergy Nausea And Vomiting  . Sulfa Antibiotics     Consultations:  Neurology   Procedures/Studies: EEG adult  Result Date: 12/18/2020 Lorrene ReidBoudreau, Michelle M, MD     12/18/2020  7:30 PM HISTORY:  75 year old Female presents with altered mental status. INTRODUCTION:  A digital EEG was performed in the laboratory using the standard international 10/20 system of electrode placement in addition to one channel of EKG monitoring.  Hyperventilation and Photic Stimulation were not performed.  This tracing captures the patient in  the awake and drowsy sleep states.  DESCRIPTION OF RECORD:  In the awake state there is diffuse slowing of the background consisting of 4 Hz frequency activity.  There is spontaneous variability seen with increase in background frequency intermittently.  Continuous generalized slowing mainly in the delta range is seen throughout the record.  There were also triphasic waves seen with an anterior-posterior gradient.  Occasionally, these occur on a regularly but only for 6-10 seconds.  At times, they are sharply contoured and therefore, generalized sharp waves in a GPED pattern cannot be excluded. Heart rate is 120 bpm. IMPRESSION:  This is an abnormal adult EEG in the awake and drowsy states.  The slowing of the background rhythm, continuous generalized slowing, and triphasic waves are consistent with an underlying moderate to severe encephalopathy.  Alternatively, it could be due to the effect of sedative medications or bilateral cerebral dysfunction.  The GPEDs are most consistent with a severe metabolic, toxic, infectious encephalopathy because they do not last long enough to meet criteria for electrographic seizure. This does not rule out the diagnosis of epilepsy.  If clinically indicated a repeat EEG or 24 hour EEG may be helpful. Clinical correlation is recommended.   ECHOCARDIOGRAM COMPLETE  Result Date: 12/19/2020    ECHOCARDIOGRAM REPORT   Patient Name:   CAGNEY STEENSON Date of Exam: 12/19/2020 Medical Rec #:  [id number]   Height:       62.0 in Accession #:    [id number]  Weight:       189.0 lb Date of Birth:  [birth year]    BSA:          1.866 m Patient Age:    74 years    BP:           100/52 mmHg Patient Gender: F           HR:           107 bpm. Exam Location:  Inpatient Procedure: 2D Echo, Cardiac Doppler and Color Doppler Indications:    CVA  History:        Patient has no prior history of Echocardiogram examinations.                 Arrythmias:Atrial Fibrillation; Risk Factors:Hypertension and                  Diabetes.  Sonographer:    Neomia Dear RDCS Referring Phys: [id number] Reyne [place] [facility]  Sonographer Comments: No cardiac surgery or porcedure noted in Epic IMPRESSIONS  1. Left ventricular ejection fraction, by estimation, is 55 to 60%. The left ventricle has normal function. The left ventricle has no regional wall motion abnormalities. Left ventricular diastolic parameters are indeterminate.  2. Right ventricular systolic function is mildly reduced. The right ventricular size is mildly enlarged. There is normal pulmonary artery systolic pressure.  3. Left atrial size was severely dilated.  4. Right atrial size was moderately dilated.  5. The mitral valve is normal in structure. Trivial mitral valve regurgitation. No evidence of mitral stenosis.  6. The aortic valve is tricuspid. Aortic valve regurgitation is trivial. Mild aortic valve sclerosis is present, with no evidence of aortic valve stenosis.  7. The patient appears to be in atrial fibrillation.  8. There is a pleural effusion. FINDINGS  Left Ventricle: Left ventricular ejection fraction, by estimation, is 55 to 60%. The left ventricle has normal function. The left ventricle has no regional wall motion abnormalities. The left ventricular internal cavity size was normal in size. There is  no left ventricular hypertrophy. Left ventricular diastolic parameters are indeterminate. Right Ventricle: The right ventricular size is mildly enlarged. No increase in right ventricular wall thickness. Right ventricular systolic function is mildly reduced. There is normal pulmonary artery systolic pressure. The tricuspid regurgitant velocity  is 2.76 m/s, and with  an assumed right atrial pressure of 3 mmHg, the estimated right ventricular systolic pressure is 33.5 mmHg. Left Atrium: Left atrial size was severely dilated. Right Atrium: Right atrial size was moderately dilated. Pericardium: There is a pleural effusion. There is no evidence of pericardial effusion. Mitral  Valve: The mitral valve is normal in structure. There is mild calcification of the mitral valve leaflet(s). Mild mitral annular calcification. Trivial mitral valve regurgitation. No evidence of mitral valve stenosis. MV peak gradient, 7.3 mmHg. The mean mitral valve gradient is 4.0 mmHg. Tricuspid Valve: The tricuspid valve is normal in structure. Tricuspid valve regurgitation is mild. Aortic Valve: The aortic valve is tricuspid. Aortic valve regurgitation is trivial. Mild aortic valve sclerosis is present, with no evidence of aortic valve stenosis. Aortic valve mean gradient measures 5.0 mmHg. Aortic valve peak gradient measures 9.0 mmHg. Aortic valve area, by VTI measures 1.86 cm. Pulmonic Valve: The pulmonic valve was normal in structure. Pulmonic valve regurgitation is not visualized. Aorta: The aortic root is normal in size and structure. IAS/Shunts: No atrial level shunt detected by color flow Doppler.  LEFT VENTRICLE PLAX 2D LVIDd:         5.00 cm LVIDs:         3.40 cm LV PW:         1.10 cm LV IVS:        0.90 cm LVOT diam:     2.00 cm LV SV:         57 LV SV Index:   30 LVOT Area:     3.14 cm  LV Volumes (MOD) LV vol d, MOD A2C: 56.5 ml LV vol d, MOD A4C: 58.7 ml LV vol s, MOD A2C: 25.0 ml LV vol s, MOD A4C: 24.6 ml LV SV MOD A2C:     31.5 ml LV SV MOD A4C:     58.7 ml LV SV MOD BP:      32.7 ml RIGHT VENTRICLE RV S prime:     8.38 cm/s TAPSE (M-mode): 1.7 cm LEFT ATRIUM              Index       RIGHT ATRIUM           Index LA diam:        4.50 cm  2.41 cm/m  RA Area:     28.30 cm LA Vol (A2C):   100.0 ml 53.59 ml/m RA Volume:   105.00 ml 56.27 ml/m LA Vol (A4C):   120.0 ml 64.30 ml/m LA Biplane Vol: 119.0 ml 63.77 ml/m  AORTIC VALVE                    PULMONIC VALVE AV Area (Vmax):    2.16 cm     PV Vmax:       1.10 m/s AV Area (Vmean):   1.96 cm     PV Vmean:      85.100 cm/s AV Area (VTI):     1.86 cm     PV VTI:        0.224 m AV Vmax:           150.00 cm/s  PV Peak grad:  4.8 mmHg AV Vmean:           110.000 cm/s PV Mean grad:  3.0 mmHg AV VTI:            0.304 m AV Peak Grad:      9.0 mmHg AV Mean Grad:  5.0 mmHg LVOT Vmax:         103.00 cm/s LVOT Vmean:        68.600 cm/s LVOT VTI:          0.180 m LVOT/AV VTI ratio: 0.59  AORTA Ao Root diam: 3.20 cm Ao Asc diam:  3.50 cm MITRAL VALVE                 TRICUSPID VALVE MV Area (PHT): 3.77 cm      TR Peak grad:   30.5 mmHg MV Area VTI:   3.07 cm      TR Vmax:        276.00 cm/s MV Peak grad:  7.3 mmHg MV Mean grad:  4.0 mmHg      SHUNTS MV Vmax:       1.35 m/s      Systemic VTI:  0.18 m MV Vmean:      95.0 cm/s     Systemic Diam: 2.00 cm MV Decel Time: 201 msec MR Peak grad:    87.2 mmHg MR Mean grad:    61.0 mmHg MR Vmax:         467.00 cm/s MR Vmean:        369.0 cm/s MR PISA:         1.01 cm MR PISA Eff ROA: 5 mm MR PISA Radius:  0.40 cm MV E velocity: 98.10 cm/s Marca Ancona MD Electronically signed by Marca Ancona MD Signature Date/Time: 12/19/2020/5:18:31 PM    Final    CT HEAD CODE STROKE WO CONTRAST  Addendum Date: 12/18/2020   ADDENDUM REPORT: 12/18/2020 12:50 ADDENDUM: These results were communicated to Dr. Amada Jupiter At 12:40 pmon 4/13/2022by text page via the [facility] messaging system. Electronically Signed   By: Jackey Loge DO   On: 12/18/2020 12:50   Result Date: 12/18/2020 CLINICAL DATA:  Code stroke. Neuro deficit, acute, stroke suspected. EXAM: CT HEAD WITHOUT CONTRAST TECHNIQUE: Contiguous axial images were obtained from the base of the skull through the vertex without intravenous contrast. COMPARISON:  No pertinent prior exams available for comparison. FINDINGS: Brain: Mild cerebral atrophy. There is no acute intracranial hemorrhage. No demarcated cortical infarct. No extra-axial fluid collection. No evidence of intracranial mass. No midline shift. Partially empty sella turcica. Vascular: No hyperdense vessel.  Atherosclerotic calcifications. Skull: Normal. Negative for fracture or focal lesion. Sinuses/Orbits: Visualized  orbits show no acute finding. Mild bilateral ethmoid sinus mucosal thickening. ASPECTS [facility] Stroke Program Early CT Score) - Ganglionic level infarction (caudate, lentiform nuclei, internal capsule, insula, M1-M3 cortex): 7 - Supraganglionic infarction (M4-M6 cortex): 3 Total score (0-10 with 10 being normal): 10 IMPRESSION: No evidence of acute intracranial abnormality.  ASPECTS is 10. Electronically Signed: By: Jackey Loge DO On: 12/18/2020 12:44   CT ANGIO HEAD NECK W WO CM (CODE STROKE)  Result Date: 12/18/2020 CLINICAL DATA:  Neuro deficit, acute, stroke suspected. EXAM: CT HEAD WITHOUT CONTRAST CT ANGIOGRAPHY HEAD AND NECK CT PERFUSION BRAIN TECHNIQUE: Multidetector CT imaging of the head and neck was performed using the standard protocol during bolus administration of intravenous contrast. Multiplanar CT image reconstructions and MIPs were obtained to evaluate the vascular anatomy. Carotid stenosis measurements (when applicable) are obtained utilizing NASCET criteria, using the distal internal carotid diameter as the denominator. Multiphase CT imaging of the brain was performed following IV bolus contrast injection. Subsequent parametric perfusion maps were calculated using RAPID software. CONTRAST:  75mL OMNIPAQUE IOHEXOL 350 MG/ML SOLN COMPARISON:  Noncontrast head CT performed earlier  today 12/18/2020. FINDINGS: CTA NECK FINDINGS Aortic arch: Standard aortic branching. Mild atherosclerotic plaque within the visualized aortic arch. No hemodynamically significant innominate or proximal subclavian artery stenosis. Right carotid system: CCA and ICA patent within the neck. Calcified plaque within the mid to distal CCA resulting in less than 50% stenosis. Left carotid system: CCA and ICA patent within the neck without stenosis. Minimal soft and calcified plaque within the CCA. Partially retropharyngeal course of the cervical ICA Vertebral arteries: The vertebral arteries are developmentally diminutive,  but patent. Moderate stenosis within the proximal left V1 segment. Skeleton: Cervical spondylosis with multilevel spinal canal and neural foraminal narrowing. Partially imaged thoracic levocurvature. No acute bony abnormality or aggressive osseous lesion. Other neck: No neck mass or cervical lymphadenopathy. Thyroid unremarkable. Upper chest: Nodules within the bilateral lung apices measuring up to 5 mm. Review of the MIP images confirms the above findings CTA HEAD FINDINGS Anterior circulation: The intracranial internal carotid arteries are patent. Calcified plaqu. E within both vessels with no more than mild stenosis The M1 middle cerebral arteries are patent. No M2 proximal branch occlusion or high-grade proximal stenosis is identified. The anterior cerebral arteries are patent with distal branch atherosclerotic irregularity but no significant proximal stenosis. No intracranial aneurysm is identified. Posterior circulation: The intracranial vertebral arteries are patent with mild atherosclerotic irregularity and sites of mild stenosis. The basilar artery is patent. The posterior cerebral arteries are patent. The P1 segments are hypoplastic bilaterally with sizable bilateral posterior communicating arteries. Venous sinuses: Within the limitations of contrast timing, no convincing thrombus. Anatomic variants: As described Review of the MIP images confirms the above findings No emergent large vessel occlusion. These results were communicated to [facility] at 1:30 pmon 4/13/2022by text page via the [facility] messaging system. IMPRESSION: CTA neck: 1. Common and internal carotid arteries are patent within the neck. Atherosclerotic plaque within the mid to distal right CCA results in less than 50% stenosis. Minimal plaque within the left CCA. 2. The vertebral arteries are developmentally diminutive, but patent. Moderate stenosis of the proximal left V1 segment. 3. Multiple pulmonary nodules within the bilateral lung  apices measuring up to 5 mm. No follow-up needed if patient is low-risk (and has no known or suspected primary neoplasm). Non-contrast chest CT can be considered in 12 months if patient is high-risk. This recommendation follows the consensus statement: Guidelines for Management of Incidental Pulmonary Nodules Detected on CT Images: From the Fleischner Society 2017; Radiology 2017; 284:228-243. CTA head: 1. No intracranial large vessel occlusion or proximal high-grade arterial stenosis. 2. Distal ACA branch atherosclerotic irregularity bilaterally. 3. Sites of mild atherosclerotic narrowing within the V4 vertebral arteries bilaterally. Electronically Signed   By: Jackey Loge DO   On: 12/18/2020 13:32       Subjective: Feels much better today.  She is oriented to place, person, time, events leading to hospitalization.  We refreshed her memory around not taking her lactulose and provided the guidance of the importance of taking that medicine so she does not wind up back here again.  Discharge Exam: Vitals:   12/19/20 2348 12/20/20 1042  BP: (!) 99/51 (!) 106/56  Pulse: 77 79  Resp: 17   Temp: 98 F (36.7 C)   SpO2: 96%    Vitals:   12/19/20 1830 12/19/20 2005 12/19/20 2348 12/20/20 1042  BP: (!) 103/49 129/70 (!) 99/51 (!) 106/56  Pulse:   77 79  Resp: (!) 21 20 17    Temp:  98.7 F (37.1 C) 98 F (  36.7 C)   TempSrc:  Oral Oral   SpO2: 95% 99% 96%     General: Pt is alert, awake, not in acute distress Cardiovascular: RRR, S1/S2 +, no rubs, no gallops Respiratory: CTA bilaterally, no wheezing, no rhonchi Abdominal: Soft, NT, ND, bowel sounds + Extremities: no edema, no cyanosis    The results of significant diagnostics from this hospitalization (including imaging, microbiology, ancillary and laboratory) are listed below for reference.     Microbiology: Recent Results (from the past 240 hour(s))  Urine culture     Status: Abnormal   Collection Time: 12/18/20  3:56 PM    Specimen: Urine, Random  Result Value Ref Range Status   Specimen Description URINE, RANDOM  Final   Special Requests   Final    NONE Performed at [facility], [address] [place], [place] [address]    Culture (A)  Final    20,000 COLONIES/mL MULTIPLE SPECIES PRESENT, SUGGEST RECOLLECTION   Report Status 12/19/2020 FINAL  Final  C Difficile Quick Screen w PCR reflex     Status: None   Collection Time: 12/18/20  4:49 PM   Specimen: STOOL  Result Value Ref Range Status   C Diff antigen NEGATIVE NEGATIVE Final   C Diff toxin NEGATIVE NEGATIVE Final   C Diff interpretation No C. difficile detected.  Final    Comment: Performed at [facility] Lab, [address], [place], [place] [address]  SARS CORONAVIRUS 2 (TAT 6-24 HRS) Nasopharyngeal Nasopharyngeal Swab     Status: None   Collection Time: 12/18/20 11:28 PM   Specimen: Nasopharyngeal Swab  Result Value Ref Range Status   SARS Coronavirus 2 NEGATIVE NEGATIVE Final    Comment: (NOTE) SARS-CoV-2 target nucleic acids are NOT DETECTED.  The SARS-CoV-2 RNA is generally detectable in upper and lower respiratory specimens during the acute phase of infection. Negative results do not preclude SARS-CoV-2 infection, do not rule out co-infections with other pathogens, and should not be used as the sole basis for treatment or other patient management decisions. Negative results must be combined with clinical observations, patient history, and epidemiological information. The expected result is Negative.  Fact Sheet for Patients: HairSlick.no  Fact Sheet for Healthcare Providers: quierodirigir.com  This test is not yet approved or cleared by the [place] FDA and  has been authorized for detection and/or diagnosis of SARS-CoV-2 by FDA under an Emergency Use Authorization (EUA). This EUA will remain  in effect (meaning this test can be used) for the duration of  the COVID-19 declaration under Se ction 564(b)(1) of the Act, 21 U.S.C. section 360bbb-3(b)(1), unless the authorization is terminated or revoked sooner.  Performed at [facility], [address], [place], [place] [address]      Labs: Basic Metabolic Panel: Recent Labs  Lab 12/18/20 1221 12/18/20 1231 12/20/20 0210  NA 142 142 139  K 4.0 3.9 3.0*  CL 98 97* 102  CO2 23  --  29  GLUCOSE 140* 130* 104*  BUN CREATININE 1.36* 1.00 1.05*  CALCIUM 8.1*  --  6.9*   Liver Function Tests: Recent Labs  Lab 12/18/20 1221 12/20/20 0210  AST 52* 57*  ALT 22 17  ALKPHOS 197* 154*  BILITOT 3.3* 1.7*  PROT 6.1* 4.5*  ALBUMIN 2.4* 1.7*   Recent Labs  Lab 12/18/20 1336  AMMONIA 115*   CBC: Recent Labs  Lab 12/18/20 1221 12/18/20 1231 12/20/20 0210  WBC 8.8  --  6.4  NEUTROABS 5.3  --   --   HGB 12.7 13.3 9.8*  HCT 40.0 39.0 30.6*  MCV 103.9*  --  102.3*  PLT 237  --  142*   CBG: Recent Labs  Lab 12/19/20 1326 12/19/20 1702 12/19/20 2127 12/20/20 0624 12/20/20 0817  GLUCAP 70 171* 159* 99 98   Hgb A1c Recent Labs    12/18/20 1354 12/19/20 0234  HGBA1C 5.8* 5.8*   Lipid Profile Recent Labs    12/18/20 1317 12/19/20 0234  CHOL 108 84  HDL 18* 12*  LDLCALC 75 62  TRIG 75 50  CHOLHDL 6.0 7.0   Thyroid function studies Recent Labs    12/18/20 1336  TSH 1.057   Urinalysis    Component Value Date/Time   COLORURINE STRAW (A) 12/18/2020 1403   APPEARANCEUR CLEAR 12/18/2020 1403   LABSPEC 1.014 12/18/2020 1403   PHURINE 5.0 12/18/2020 1403   GLUCOSEU NEGATIVE 12/18/2020 1403   HGBUR NEGATIVE 12/18/2020 1403   BILIRUBINUR NEGATIVE 12/18/2020 1403   KETONESUR NEGATIVE 12/18/2020 1403   PROTEINUR NEGATIVE 12/18/2020 1403   NITRITE NEGATIVE 12/18/2020 1403   LEUKOCYTESUR NEGATIVE 12/18/2020 1403   Sepsis Labs Invalid input(s): PROCALCITONIN,  WBC,  LACTICIDVEN Microbiology Recent Results (from the past 240 hour(s))  Urine  culture     Status: Abnormal   Collection Time: 12/18/20  3:56 PM   Specimen: Urine, Random  Result Value Ref Range Status   Specimen Description URINE, RANDOM  Final   Special Requests   Final    NONE Performed at [facility], [address] [place], [place] [address]    Culture (A)  Final    20,000 COLONIES/mL MULTIPLE SPECIES PRESENT, SUGGEST RECOLLECTION   Report Status 12/19/2020 FINAL  Final  C Difficile Quick Screen w PCR reflex     Status: None   Collection Time: 12/18/20  4:49 PM   Specimen: STOOL  Result Value Ref Range Status   C Diff antigen NEGATIVE NEGATIVE Final   C Diff toxin NEGATIVE NEGATIVE Final   C Diff interpretation No C. difficile detected.  Final    Comment: Performed at [facility], [address] [place], [place] [address]  SARS CORONAVIRUS 2 (TAT 6-24 HRS) Nasopharyngeal Nasopharyngeal Swab     Status: None   Collection Time: 12/18/20 11:28 PM   Specimen: Nasopharyngeal Swab  Result Value Ref Range Status   SARS Coronavirus 2 NEGATIVE NEGATIVE Final    Comment: (NOTE) SARS-CoV-2 target nucleic acids are NOT DETECTED.  The SARS-CoV-2 RNA is generally detectable in upper and lower respiratory specimens during the acute phase of infection. Negative results do not preclude SARS-CoV-2 infection, do not rule out co-infections with other pathogens, and should not be used as the sole basis for treatment or other patient management decisions. Negative results must be combined with clinical observations, patient history, and epidemiological information. The expected result is Negative.  Fact Sheet for Patients: HairSlick.no  Fact Sheet for Healthcare Providers: quierodirigir.com  This test is not yet approved or cleared by the [place] FDA and  has been authorized for detection and/or diagnosis of SARS-CoV-2 by FDA under an Emergency Use Authorization (EUA). This EUA will remain   in effect (meaning this test can be used) for the duration of the COVID-19 declaration under Se ction 564(b)(1) of the Act, 21 U.S.C. section 360bbb-3(b)(1), unless the authorization is terminated or revoked sooner.  Performed at [facility], [address], [place],  [place] [address]      Time coordinating discharge: Over 30 minutes  SIGNED:   Reva Bores, MD  [facility] 12/20/2020, 11:20 AM  If 7PM-7AM, please contact night-coverage

## 2020-12-20 NOTE — Discharge Instructions (Signed)
Hepatic Encephalopathy  Hepatic encephalopathy is a change in brain function that includes changes in the ability to think and to use muscles. This condition happens when a person has advanced liver disease. When the liver is damaged, harmful substances (toxins) can build up in the body. Some of these toxins, such as ammonia, can harm the brain. The effects of the condition depend on the type of liver damage and how severe it is. In some cases, hepatic encephalopathy can be reversed. What are the causes? Certain things can trigger or worsen liver function, which can result in hepatic encephalopathy. These things include:  Infection.  Constipation.  Taking certain medicines, such as benzodiazepines.  Alcohol use.  Bleeding into the intestinal tract.  Imbalances in minerals (electrolytes) in the body.  Dehydration. Hepatic encephalopathy can sometimes be reversed if these triggers are resolved. What increases the risk? You are at risk of developing this condition if you have advanced liver disease (cirrhosis). Conditions that can cause liver disease include:  Infections in the liver, such as hepatitis C.  Infections in the blood.  Drinking a lot of alcohol over a long period of time.  Taking certain medicines, including tranquilizers, diuretics, antidepressants, sleeping pills, or acetaminophen.  Genetic diseases, such as Wilson's disease. What are the signs or symptoms? Symptoms may develop suddenly or may develop slowly and get worse gradually. Symptoms can range from mild to severe. Mild symptoms include:  Mild confusion.  Shortened attention span.  Personality and mood changes.  Anxiety and agitation.  Drowsiness. Symptoms of worsening or severe hepatic encephalopathy include:  Extreme confusion (disorientation).  Slowed movement.  Slurred speech.  Extreme personality changes.  Abnormal shaking or flapping of the hands (asterixis).  Coma. How is this  diagnosed? This condition may be diagnosed based on:  A physical exam.  Your symptoms and medical history.  Blood tests. These may be done to check levels of ammonia in your blood, measure how long it takes your blood to clot, or check for infection.  Liver function tests. These may be done to check how well your liver is working.  MRI and CT scans. These may be done to check for a brain disorder and to check for problems with your liver.  Electroencephalogram (EEG). This test measures the electrical activity in your brain. How is this treated? The first step in treatment is to identify and treat the cause of your liver damage or triggering illness, if possible. The next step is taking medicine to lower the level of toxins in your body and prevent ammonia from building up. Treatment will depend on how severe your encephalopathy is, and may include:  Medicine to lower your ammonia level (lactulose).  Antibiotic medicine to reduce the amount of ammonia-producing bacteria in your gut.  Close monitoring of your blood pressure, heart rate, breathing, and oxygen levels.  Removal of fluid from your abdomen.  Close monitoring of how you think, feel, and act (mental status).  Changes to your diet.  Liver transplant, in severe cases. Follow these instructions at home: Medicines  Take over-the-counter and prescription medicines only as told by your health care provider.  If you were prescribed an antibiotic medicine, take it as told by your health care provider. Do not stop using the antibiotic even if you start to feel better.  Do not start taking any new medicines, including over-the-counter medicines, without first checking with your health care provider. Eating and drinking  Work with a dietitian or your health care provider   to make sure you are getting the right balance of protein and minerals.  Eat small meals throughout the day with a late-night snack of complex carbohydrates.  Do not fast.  Drink enough fluids to keep your urine pale yellow.  Do not drink alcohol.   General instructions  Do not use drugs.  Ask your health care provider if it is safe for you to drive.  Keep all follow-up visits. This is important. Contact a health care provider if:  You develop new symptoms.  Your symptoms change or get worse.  You have a fever or chills.  You have persistent nausea, vomiting, or diarrhea. Get help right away if:  You become very confused or drowsy.  You vomit blood or material that looks like coffee grounds.  Your stool is bloody, black, or looks like tar. Summary  Hepatic encephalopathy is a change in brain function that includes changes in the ability to think and to use muscles. This condition happens when a person has advanced liver disease.  Certain things can trigger or worsen hepatic encephalopathy. Hepatic encephalopathy can sometimes be reversed if these triggers are resolved.  The first step in treatment is to identify and treat the cause of your liver damage or triggering illness, if possible. The next step is taking medicine to lower the level of toxins in your body and prevent ammonia from building up.  Your treatment will depend on how severe your hepatic encephalopathy is. This information is not intended to replace advice given to you by your health care provider. Make sure you discuss any questions you have with your health care provider. Document Revised: 05/21/2020 Document Reviewed: 05/21/2020 Elsevier Patient Education  2021 Elsevier Inc.  

## 2020-12-20 NOTE — Plan of Care (Signed)

## 2020-12-20 NOTE — Care Management Obs Status (Signed)
MEDICARE OBSERVATION STATUS NOTIFICATION   Patient Details  Name: Kari Day MRN: [id number] Date of Birth: January 01, 1946   Medicare Observation Status Notification Given:  Yes    Kermit Balo, RN 12/20/2020, 10:30 AM

## 2020-12-20 NOTE — Progress Notes (Signed)
ANTICOAGULATION CONSULT NOTE  Pharmacy Consult for warfarin dosing. Indication: atrial fibrillation  Allergies  Allergen Reactions  . Caffeine   . Codeine   . Dilaudid [Hydromorphone]   . Fish Allergy   . Naproxen   . Nsaids   . Percocet [Oxycodone-Acetaminophen]   . Shellfish Allergy Nausea And Vomiting  . Sulfa Antibiotics    Patient Measurements:    Vital Signs: Temp: 98 F (36.7 C) (04/14 2348) Temp Source: Oral (04/14 2348) BP: 99/51 (04/14 2348) Pulse Rate: 77 (04/14 2348)  Labs: Recent Labs    12/18/20 1221 12/18/20 1231 12/19/20 0234 12/20/20 0210  HGB 12.7 13.3  --  9.8*  HCT 40.0 39.0  --  30.6*  PLT 237  --   --  142*  APTT 39*  --   --   --   LABPROT 26.7*  --  29.0* 36.1*  INR 2.5*  --  2.7* 3.6*  CREATININE 1.36* 1.00  --  1.05*   CrCl cannot be calculated (Unknown ideal weight.).   Assessment: 75 year old female on warfarin PTA for atrial fibrillation. Presented to ED on 4/13 to rule out stroke and found to be in afib with RVR (HR 120s). Code stroke initiated, seen by neurology, and CTa head/neck was without significant stenosis. Pharmacy has been consulted to dose warfarin.    PTA Warfarin: 5 mg daily except 2.5 mg on Friday (recently changed from 6 mg daily except 3 mg on Fridays)  INR was 2.5 on admit (4/13)  INR now high at 3.6  Goal of Therapy:  INR 2-3 Monitor platelets by anticoagulation protocol: Yes   Plan:  No warfarin today - Monitor daily INR - Monitor for s/sx of bleeding  Thank you Okey Regal, PharmD 12/20/2020 8:14 AM  Please check [email or web address] for unit-specific pharmacy phone numbers.

## 2020-12-20 NOTE — Progress Notes (Signed)
SLP Cancellation Note  Patient Details Name: Alizaya Oshea MRN: [id number] DOB: [birth year]   Cancelled treatment:       Reason Eval/Treat Not Completed: Patient declined, no reason specified; pt refused assessment; stated "I don't have any problems with finding my words, I don't need that, I'm going home."  ST will s/o at this time.   Tressie Stalker, M.S., CCC-SLP 12/20/2020, 10:00 AM

## 2020-12-20 NOTE — TOC Progression Note (Signed)
Transition of Care [facility]) - Progression Note    Patient Details  Name: Nonie Lochner MRN: [id number] Date of Birth: Jun 20, 1946  [facility] of [facility] CM/SW Contact  Carley Hammed, [place] Phone Number: 12/20/2020, 10:21 AM  Clinical Narrative:    CSW was notified by MD that the plan has changed, and family wants to take pt home at this time. CSW confirmed this with pt's dtr, who also noted that they have all the equipment they need. They also are set up with [facility] for [facility], orders requested. Pt's dtr also confirmed she will pick her mother up, and transport her home herself. Frances Furbish noted that they will not be able to start coverage until Monday, so family will be responsible for wound care, family updated. Obs info given.    Expected Discharge Plan: Skilled Nursing Facility Barriers to Discharge: Continued Medical Work up,Insurance Authorization  Expected Discharge Plan and Services Expected Discharge Plan: Skilled Nursing Facility     Post Acute Care Choice: Skilled Nursing Facility Living arrangements for the past 2 months: Skilled Nursing Facility                                       Social Determinants of Health (SDOH) Interventions    Readmission Risk Interventions No flowsheet data found.

## 2020-12-20 NOTE — Progress Notes (Signed)
Patient refused CT, MD notified. Patient said she may let them do it in the morning but said she did not want to be on a schedule for the morning.

## 2021-04-07 DEATH — deceased

## 2022-12-10 IMAGING — CT CT HEAD CODE STROKE
3 series · 14 of 46 positions shown, 16 images · non-contrast
Comparison: No pertinent prior exams available for comparison.
COMPARISON: No pertinent prior exams available for comparison.

Addendum:
CLINICAL DATA: Code stroke. Neuro deficit, acute, stroke suspected.

EXAM:
CT HEAD WITHOUT CONTRAST
TECHNIQUE: Contiguous axial images were obtained from the base of the skull
through the vertex without intravenous contrast.

[Series 3: head 5.0 st · axial · 0.47mm/px · z∈[-118,+2]mm · 8 of 29 slices shown, 10 images]
[im 3/29  brain]
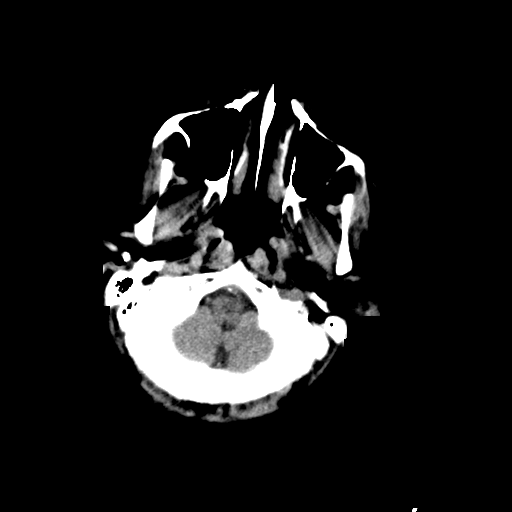
[im 3/29  bone]
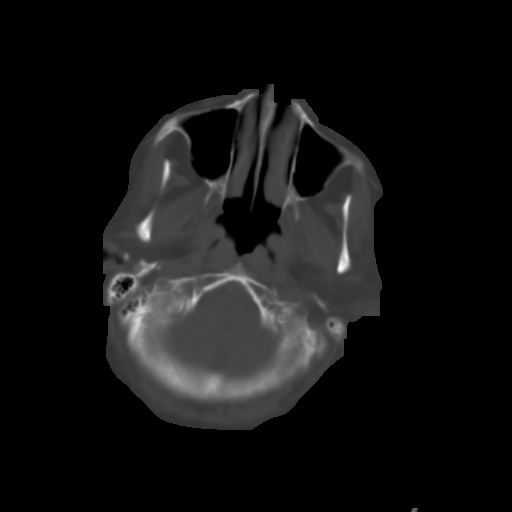
[im 7/29  brain]
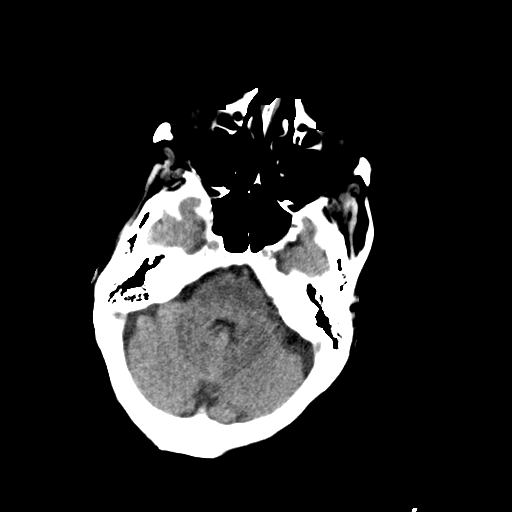
[im 10/29  brain]
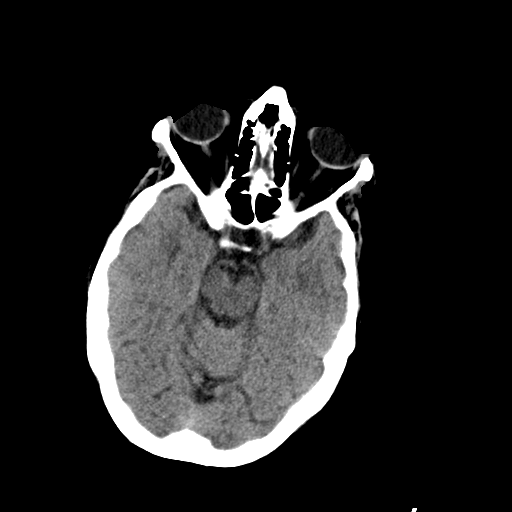
[im 13/29  brain]
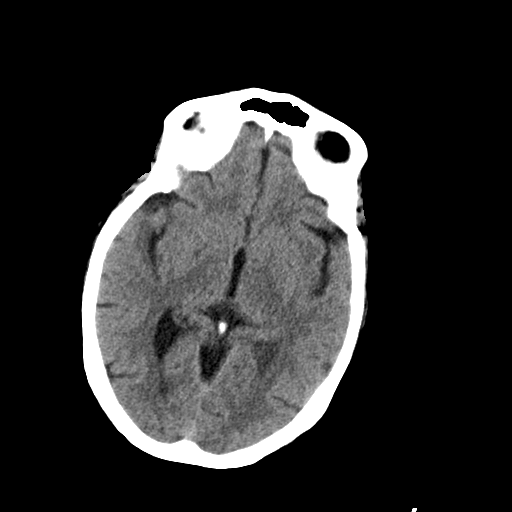
[im 17/29  brain]
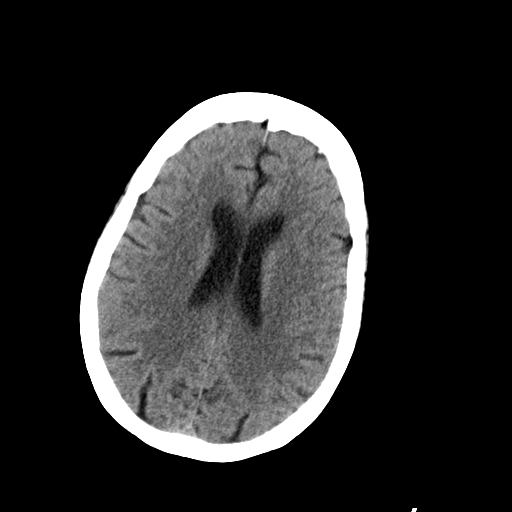
[im 17/29  bone]
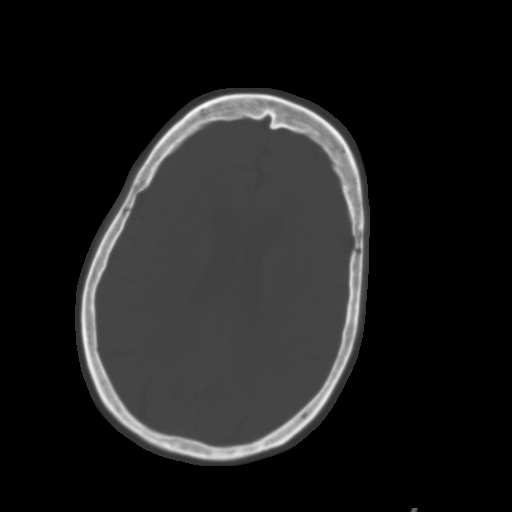
[im 20/29  brain]
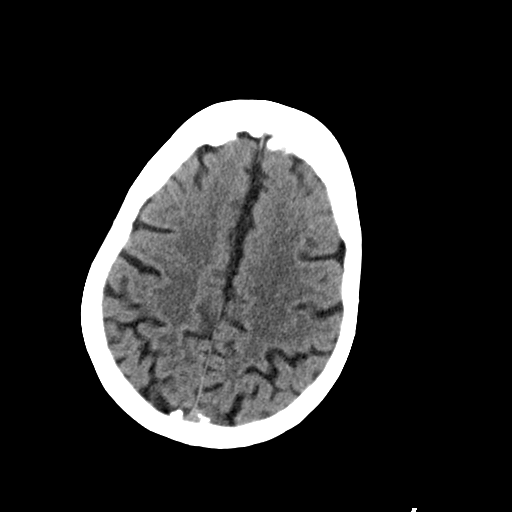
[im 23/29  brain]
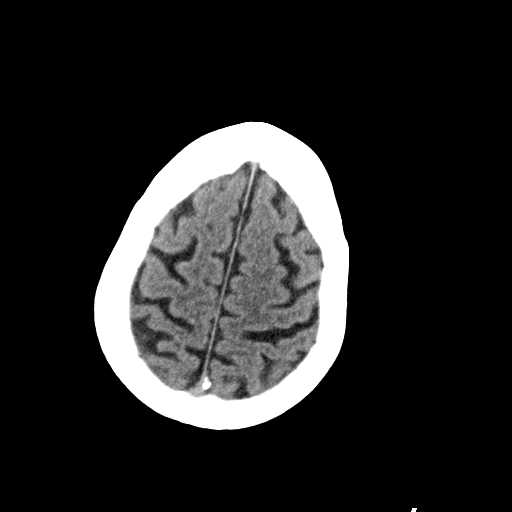
[im 27/29  brain]
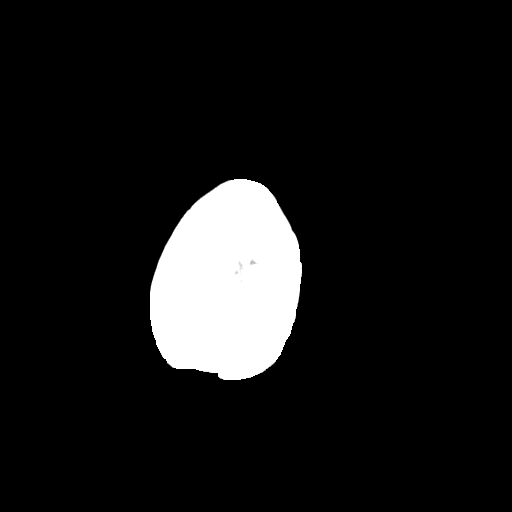

[Series 5: head 3.0 cor st · coronal · 0.29mm/px · 3 of 73 slices shown]
[im 25/73  brain]
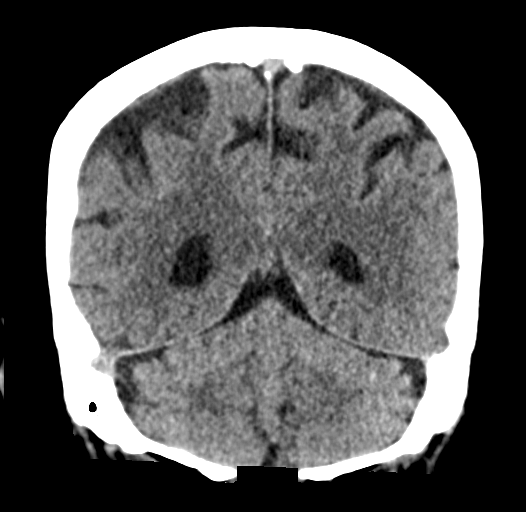
[im 33/73  brain]
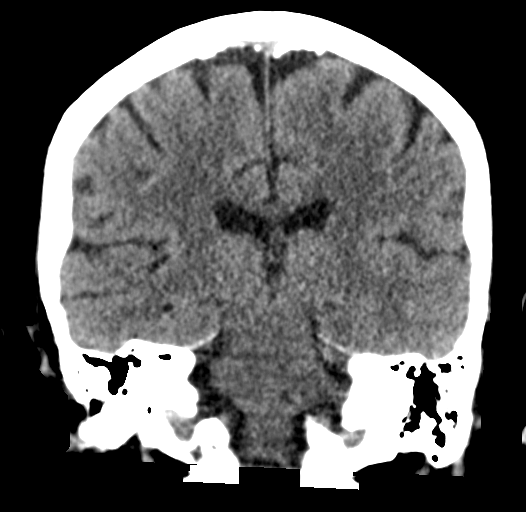
[im 41/73  brain]
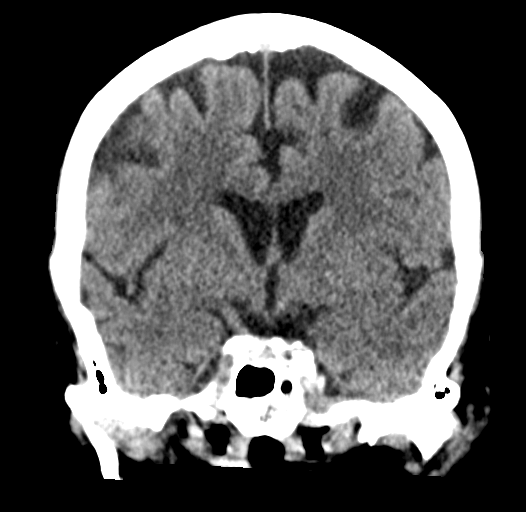

[Series 6: head 3.0 sag st · sagittal · 0.32mm/px · 3 of 65 slices shown]
[im 22/65  brain]
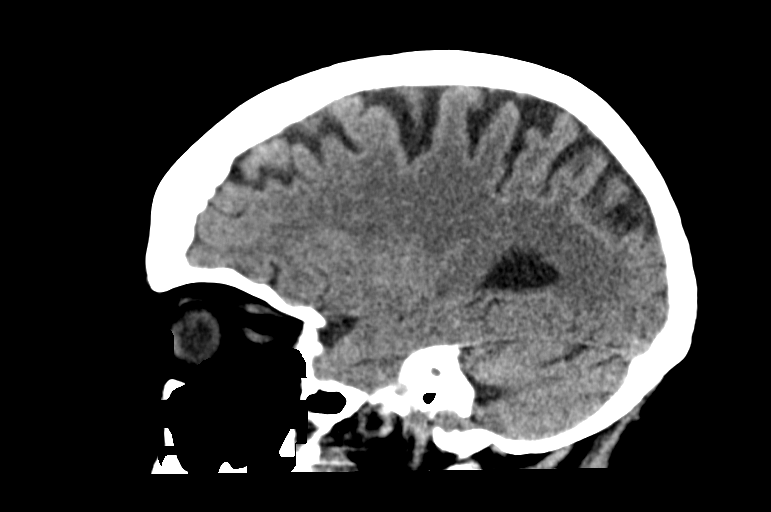
[im 33/65  brain]
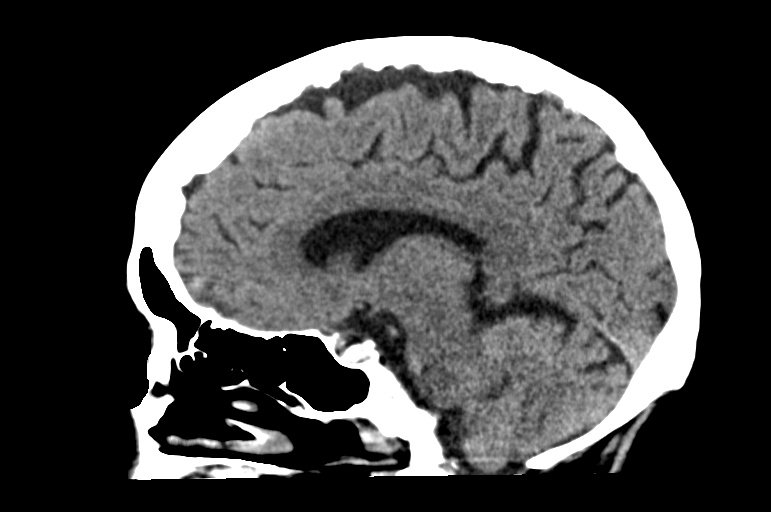
[im 43/65  brain]
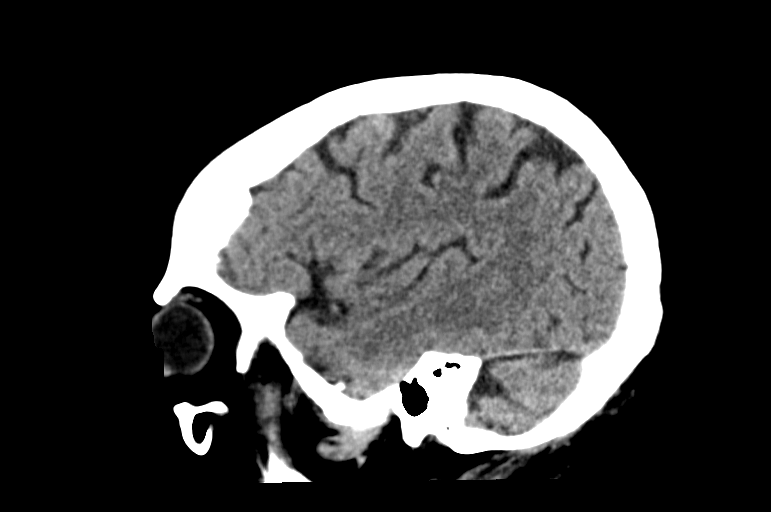

[14 of 46 positions shown; findings below may reference images not displayed]

FINDINGS: Brain:

Mild cerebral atrophy.

There is no acute intracranial hemorrhage.

No demarcated cortical infarct.

No extra-axial fluid collection.

No evidence of intracranial mass.

No midline shift.

Partially empty sella turcica.

Vascular: No hyperdense vessel.  Atherosclerotic calcifications.

Skull: Normal. Negative for fracture or focal lesion.

Sinuses/Orbits: Visualized orbits show no acute finding. Mild
bilateral ethmoid sinus mucosal thickening.

ASPECTS (Alberta Stroke Program Early CT Score)

- Ganglionic level infarction (caudate, lentiform nuclei, internal
capsule, insula, M1-M3 cortex): 7

- Supraganglionic infarction (M4-M6 cortex): 3

Total score (0-10 with 10 being normal): 10
IMPRESSION: No evidence of acute intracranial abnormality.  ASPECTS is 10.

ADDENDUM:
These results were communicated to Dr. Mighri At [DATE] pmon
12/18/2020by text page via the AMION messaging system.

*** End of Addendum ***
FINDINGS: Brain:

Mild cerebral atrophy.

There is no acute intracranial hemorrhage.

No demarcated cortical infarct.

No extra-axial fluid collection.

No evidence of intracranial mass.

No midline shift.

Partially empty sella turcica.

Vascular: No hyperdense vessel.  Atherosclerotic calcifications.

Skull: Normal. Negative for fracture or focal lesion.

Sinuses/Orbits: Visualized orbits show no acute finding. Mild
bilateral ethmoid sinus mucosal thickening.

ASPECTS (Alberta Stroke Program Early CT Score)

- Ganglionic level infarction (caudate, lentiform nuclei, internal
capsule, insula, M1-M3 cortex): 7

- Supraganglionic infarction (M4-M6 cortex): 3

Total score (0-10 with 10 being normal): 10
IMPRESSION: No evidence of acute intracranial abnormality.  ASPECTS is 10.
